# Patient Record
Sex: Male | Born: 1935 | Race: White | Hispanic: No | Smoking: Current every day smoker
Health system: Southern US, Community
[De-identification: ages and names within clinical notes are randomized; demographics above are authoritative.]

## PROBLEM LIST (undated history)

## (undated) DIAGNOSIS — F329 Major depressive disorder, single episode, unspecified: Secondary | ICD-10-CM

## (undated) DIAGNOSIS — R059 Cough, unspecified: Secondary | ICD-10-CM

## (undated) DIAGNOSIS — I10 Essential (primary) hypertension: Secondary | ICD-10-CM

## (undated) DIAGNOSIS — F32A Depression, unspecified: Secondary | ICD-10-CM

## (undated) DIAGNOSIS — G4730 Sleep apnea, unspecified: Secondary | ICD-10-CM

## (undated) DIAGNOSIS — H3530 Unspecified macular degeneration: Secondary | ICD-10-CM

## (undated) DIAGNOSIS — R05 Cough: Secondary | ICD-10-CM

## (undated) DIAGNOSIS — C801 Malignant (primary) neoplasm, unspecified: Secondary | ICD-10-CM

## (undated) DIAGNOSIS — R590 Localized enlarged lymph nodes: Secondary | ICD-10-CM

## (undated) HISTORY — DX: Cough: R05

## (undated) HISTORY — PX: COLONOSCOPY: SHX5424

## (undated) HISTORY — PX: PROSTATECTOMY: SHX69

## (undated) HISTORY — PX: TOTAL KNEE ARTHROPLASTY: SHX125

## (undated) HISTORY — DX: Sleep apnea, unspecified: G47.30

## (undated) HISTORY — PX: UVULOPALATOPHARYNGOPLASTY: SHX827

## (undated) HISTORY — PX: SEPTOPLASTY: SUR1290

## (undated) HISTORY — DX: Major depressive disorder, single episode, unspecified: F32.9

## (undated) HISTORY — DX: Essential (primary) hypertension: I10

## (undated) HISTORY — DX: Depression, unspecified: F32.A

## (undated) HISTORY — DX: Cough, unspecified: R05.9

---

## 2000-11-13 ENCOUNTER — Ambulatory Visit (HOSPITAL_COMMUNITY): Admission: RE | Admit: 2000-11-13 | Discharge: 2000-11-13 | Payer: Self-pay | Admitting: Internal Medicine

## 2001-05-24 ENCOUNTER — Encounter: Admission: RE | Admit: 2001-05-24 | Discharge: 2001-05-24 | Payer: Self-pay | Admitting: Urology

## 2001-05-24 ENCOUNTER — Encounter: Payer: Self-pay | Admitting: Urology

## 2001-06-11 ENCOUNTER — Ambulatory Visit: Admission: RE | Admit: 2001-06-11 | Discharge: 2001-09-09 | Payer: Self-pay | Admitting: Radiation Oncology

## 2001-07-26 ENCOUNTER — Encounter: Payer: Self-pay | Admitting: Urology

## 2001-08-02 ENCOUNTER — Inpatient Hospital Stay (HOSPITAL_COMMUNITY): Admission: RE | Admit: 2001-08-02 | Discharge: 2001-08-05 | Payer: Self-pay | Admitting: Urology

## 2001-12-18 ENCOUNTER — Other Ambulatory Visit: Admission: RE | Admit: 2001-12-18 | Discharge: 2001-12-18 | Payer: Self-pay | Admitting: Dermatology

## 2005-06-23 ENCOUNTER — Ambulatory Visit: Admission: RE | Admit: 2005-06-23 | Discharge: 2005-06-23 | Payer: Self-pay | Admitting: Internal Medicine

## 2005-07-04 ENCOUNTER — Ambulatory Visit: Payer: Self-pay | Admitting: Pulmonary Disease

## 2005-08-03 ENCOUNTER — Ambulatory Visit: Payer: Self-pay | Admitting: Pulmonary Disease

## 2006-07-19 ENCOUNTER — Emergency Department (HOSPITAL_COMMUNITY): Admission: EM | Admit: 2006-07-19 | Discharge: 2006-07-19 | Payer: Self-pay | Admitting: Emergency Medicine

## 2010-10-14 ENCOUNTER — Other Ambulatory Visit (HOSPITAL_COMMUNITY): Payer: Self-pay | Admitting: Internal Medicine

## 2010-10-14 ENCOUNTER — Ambulatory Visit (HOSPITAL_COMMUNITY)
Admission: RE | Admit: 2010-10-14 | Discharge: 2010-10-14 | Disposition: A | Discharge status: Home / Self Care | Payer: Medicare Other | Source: Ambulatory Visit | Attending: Internal Medicine | Admitting: Internal Medicine

## 2010-10-14 DIAGNOSIS — R05 Cough: Secondary | ICD-10-CM

## 2010-10-14 DIAGNOSIS — F17200 Nicotine dependence, unspecified, uncomplicated: Secondary | ICD-10-CM | POA: Insufficient documentation

## 2010-10-14 DIAGNOSIS — R059 Cough, unspecified: Secondary | ICD-10-CM | POA: Insufficient documentation

## 2010-10-25 ENCOUNTER — Telehealth: Payer: Self-pay | Admitting: Pulmonary Disease

## 2010-10-25 NOTE — Telephone Encounter (Signed)
ATC patient to inform of our policy on changing MD's. Home and Work numbers continue to say disconnected. Will have to wait for patient to call back and be SURE to get WORKING numbers for patient.

## 2010-10-31 NOTE — Telephone Encounter (Signed)
Phone still disconnected, will sign encounter and await for pt to call back

## 2010-12-05 ENCOUNTER — Encounter: Payer: Self-pay | Admitting: Internal Medicine

## 2010-12-05 ENCOUNTER — Ambulatory Visit (INDEPENDENT_AMBULATORY_CARE_PROVIDER_SITE_OTHER): Payer: Medicare Other | Admitting: Internal Medicine

## 2010-12-05 VITALS — BP 132/60 | HR 74 | Ht 70.0 in | Wt 206.0 lb

## 2010-12-05 DIAGNOSIS — J449 Chronic obstructive pulmonary disease, unspecified: Secondary | ICD-10-CM

## 2010-12-05 DIAGNOSIS — G4733 Obstructive sleep apnea (adult) (pediatric): Secondary | ICD-10-CM | POA: Insufficient documentation

## 2010-12-05 DIAGNOSIS — J310 Chronic rhinitis: Secondary | ICD-10-CM

## 2010-12-05 NOTE — Assessment & Plan Note (Addendum)
Severe OSA,. Will autotitrate for pressure update and see how we do add improving compliance. He lives alone but nobody to complain or to remind him to wear his CPAP. We considered alternative management options. Our success will depend on our ability to improve his daytime tiredness.Marland Kitchen

## 2010-12-05 NOTE — Progress Notes (Signed)
Subjective:    Patient ID: Alexander Young, male    DOB: March 11, 1930, 77 y.o.   MRN: [id number]  HPI 12/05/10- 23 yoM smoker, referred courtesy of Dr Ouida Sills for evaluation of fatigue " no energy" with hx of OSA. NPSG 06/23/05-severe obstructive sleep apnea, AHI 86 per hour. He had oxygen desaturation during sleep and home oxygen for sleep was recommended based on this study. He also had frequent limb jerks. Marland Kitchen He dropped off CPAP years  ago because it made his nose stuffy. His wife had light CPAP as it stopped his snoring, but she has passed away and he now lives alone. His main complaint is of daytime fatigue and lack of energy which is not always recognizes sleepiness. He doesn't think he is depressed but that has been evaluated. Bedtime is between 8 and 9 PM with sleep latency 5-10 minutes. He then wakes 3-6 times during the night for nocturia (history of prostatectomy) before finally up at 6:30 AM. Weight has varied about 10 pounds over the last few years. He had had limited palatoplasty with septoplasty by ENT surgeon with no benefit seen. He is smoking 10 cigarettes per day and recognizes occasional mild wheeze but denies shortness of breath. Her blood pressure is been treated with lisinopril but he denies significant cough. There is some history of allergic nasal congestion and spring and summer. He is widowed, a retired Tour manager.   Review of Systems Constitutional:   No-   weight loss, night sweats, fevers, chills,              + fatigue, lassitude. HEENT:   No-  headaches, difficulty swallowing, tooth/dental problems, sore throat,       No-  sneezing, itching, ear ache, nasal congestion, post nasal drip,  CV:  No-   chest pain, orthopnea, PND, swelling in lower extremities, anasarca, dizziness, palpitations Resp: No-   shortness of breath with exertion or at rest.              No-   productive cough,  No non-productive cough,  No-  coughing up of blood.              No-   change  in color of mucus.  No- wheezing.   Skin: No-   rash or lesions. GI:  No-   heartburn, indigestion, abdominal pain, nausea, vomiting, diarrhea,                 change in bowel habits, loss of appetite GU: No-   dysuria, change in color of urine, no urgency or frequency.  No- flank pain. MS:  No-   joint pain or swelling.  No- decreased range of motion.  No- back pain. Neuro- grossly normal to observation, Or:  Psych:  No- change in mood or affect. No depression or anxiety.  No memory loss.      Objective:   Physical Exam General- Alert, Oriented, Affect-appropriate, Distress- none acute; medium build Skin- rash-none, lesions- none, excoriation- none Lymphadenopathy- none Head- atraumatic            Eyes- Gross vision intact, PERRLA, conjunctivae clear secretions            Ears- Hearing, - reduced            Nose- turbinates edema and mild septal deviation with some external deviation and narrowed airway, No- mucus, polyps, erosion, perforation             Throat- Mallampati III-IV-  s/p minor reduction of uvula, mucosa clear , drainage- none, tonsils- atrophic Neck- flexible , trachea midline, no stridor , thyroid nl, carotid no bruit Chest - symmetrical excursion , unlabored           Heart/CV- RRR , no murmur , no gallop  , no rub, nl s1 s2                           - JVD- none , edema- none, stasis changes- none, varices- none           Lung- mild rhonchi and minimal wheeze, unlabored, cough- none , dullness-none, rub- none           Chest wall-  Abd- tender-no, distended-no, bowel sounds-present, HSM- no Br/ Gen/ Rectal- Not done, not indicated Extrem- cyanosis- none, clubbing, none, atrophy- none, strength- nl Neuro- grossly intact to observation         Assessment & Plan:

## 2010-12-05 NOTE — Assessment & Plan Note (Addendum)
Ice or rhonchi and suspect he has a chronic bronchitis and COPD. I don't believe he can hear his own airway noise. Schedule PFT

## 2010-12-05 NOTE — Assessment & Plan Note (Signed)
Sample Nasonex

## 2010-12-05 NOTE — Patient Instructions (Signed)
Order- [facility]  - autotitrate for pressure recommendation 5-20 cwp x 2 weeks and assess for best fit mask  Dx OSA  Schedule PFT   Dx COPD  Sample Nasonex- 1-2 sprays each nostril, every night at bedtime

## 2011-01-18 ENCOUNTER — Encounter: Payer: Self-pay | Admitting: Internal Medicine

## 2011-01-18 ENCOUNTER — Ambulatory Visit (INDEPENDENT_AMBULATORY_CARE_PROVIDER_SITE_OTHER): Payer: Medicare Other | Admitting: Internal Medicine

## 2011-01-18 VITALS — BP 124/68 | HR 67 | Ht 70.0 in | Wt 203.4 lb

## 2011-01-18 DIAGNOSIS — J310 Chronic rhinitis: Secondary | ICD-10-CM

## 2011-01-18 DIAGNOSIS — G4733 Obstructive sleep apnea (adult) (pediatric): Secondary | ICD-10-CM

## 2011-01-18 NOTE — Progress Notes (Signed)
Patient ID: Alexander Young, male    DOB: Feb 23, 1930, 77 y.o.   MRN: [id number]  HPI 12/05/10- 55 yoM smoker, referred courtesy of Dr Ouida Sills for evaluation of fatigue " no energy" with hx of OSA. NPSG 06/23/05-severe obstructive sleep apnea, AHI 86 per hour. He had oxygen desaturation during sleep and home oxygen for sleep was recommended based on this study. He also had frequent limb jerks. Marland Kitchen He dropped off CPAP years  ago because it made his nose stuffy. His wife had liked CPAP as it stopped his snoring, but she has passed away and he now lives alone. His main complaint is of daytime fatigue and lack of energy which is not always recognized as sleepiness. He doesn't think he is depressed but that has been evaluated. Bedtime is between 8 and 9 PM with sleep latency 5-10 minutes. He then wakes 3-6 times during the night for nocturia (history of prostatectomy) before finally up at 6:30 AM. Weight has varied about 10 pounds over the last few years. He had had limited palatoplasty with septoplasty by ENT surgeon with no benefit seen. He is smoking 10 cigarettes per day and recognizes occasional mild wheeze but denies shortness of breath. Her blood pressure is being treated with lisinopril but he denies significant cough. There is some history of allergic nasal congestion in spring and summer. He is widowed, a retired Tour manager.  01/18/11- OSA, limb jerks, hx UPPP, failed CPAP, rhinitis, tobacco He gave up on CPAP, not able to keep it on more than 2 hours at a time. He blames nasal congestion which begins as soon as he lies down and is worse with CPAP. He also has nocturia as much as 5 or 6 times a night. We discussed alternatives to CPAP therapy.  Review of Systems Constitutional:   No-   weight loss, night sweats, fevers, chills,              + fatigue, lassitude. HEENT:   No-  headaches, difficulty swallowing, tooth/dental problems, sore throat,       No-  sneezing, itching, ear ache, nasal  congestion, post nasal drip,  CV:  No-   chest pain, orthopnea, PND, swelling in lower extremities, anasarca, dizziness, palpitations Resp: No-   shortness of breath with exertion or at rest.              No-   productive cough,  No non-productive cough,  No-  coughing up of blood.              No-   change in color of mucus.  No- wheezing.   Skin: No-   rash or lesions. GI:  No-   heartburn, indigestion, abdominal pain, nausea, vomiting, diarrhea,                 change in bowel habits, loss of appetite GU: No-   dysuria, change in color of urine, no urgency + frequency.  No- flank pain. MS:  No-   joint pain or swelling.  No- decreased range of motion.  No- back pain. Neuro- grossly normal to observation, Or:  Psych:  No- change in mood or affect. No depression or anxiety.  No memory loss.      Objective:   Physical Exam General- Alert, Oriented, Affect-appropriate, Distress- none acute; overweight Skin- rash-none, lesions- none, excoriation- none Lymphadenopathy- none Head- atraumatic            Eyes- Gross vision intact, PERRLA, conjunctivae  clear secretions            Ears- Hearing, - reduced            Nose- turbinates edema and mild septal deviation with some external deviation and narrowed airway, No- mucus, polyps, erosion, perforation             Throat- Mallampati III-IV- s/p minor reduction of uvula, mucosa clear , drainage- none, tonsils- atrophic Neck- flexible , trachea midline, no stridor , thyroid nl, carotid no bruit Chest - symmetrical excursion , unlabored           Heart/CV- RRR , no murmur , no gallop  , no rub, nl s1 s2                           - JVD- none , edema- none, stasis changes- none, varices- none           Lung- mild rhonchi and minimal wheeze, unlabored, cough- none , dullness-none, rub- none           Chest wall-  Abd- tender-no, distended-no, bowel sounds-present, HSM- no Br/ Gen/ Rectal- Not done, not indicated Extrem- cyanosis- none, clubbing,  none, atrophy- none, strength- nl Neuro- grossly intact to observation

## 2011-01-18 NOTE — Patient Instructions (Signed)
Try Sudafed an hour before bedtime- see if it stops your stuffy nose and what it does to your need to get up for the bathroom at night.  You don't want the strongest Sudafed- see if they have something like 30 mg or 60 mg. You could also ask the drug store for the alternative to sudafed called phenylephrine, which is on the shelf.  We will stop CPAP for now.

## 2011-01-21 NOTE — Assessment & Plan Note (Signed)
Sudafed may reduce nasal congestion and also improve bladder sphincter control. It's worth a trial.

## 2011-01-21 NOTE — Assessment & Plan Note (Signed)
He doesn't want to do anything more about CPAP for sleep apnea. I was able to review again, alternatives. He is going to try Sudafed. We emphasized weight loss.

## 2011-01-25 ENCOUNTER — Encounter: Payer: Self-pay | Admitting: Internal Medicine

## 2011-07-03 DIAGNOSIS — C61 Malignant neoplasm of prostate: Secondary | ICD-10-CM | POA: Diagnosis not present

## 2011-07-28 DIAGNOSIS — I10 Essential (primary) hypertension: Secondary | ICD-10-CM | POA: Diagnosis not present

## 2011-07-28 DIAGNOSIS — R7301 Impaired fasting glucose: Secondary | ICD-10-CM | POA: Diagnosis not present

## 2011-07-28 DIAGNOSIS — Z79899 Other long term (current) drug therapy: Secondary | ICD-10-CM | POA: Diagnosis not present

## 2011-08-03 DIAGNOSIS — R7301 Impaired fasting glucose: Secondary | ICD-10-CM | POA: Diagnosis not present

## 2011-08-03 DIAGNOSIS — M1990 Unspecified osteoarthritis, unspecified site: Secondary | ICD-10-CM | POA: Diagnosis not present

## 2011-08-03 DIAGNOSIS — I10 Essential (primary) hypertension: Secondary | ICD-10-CM | POA: Diagnosis not present

## 2011-08-03 DIAGNOSIS — C61 Malignant neoplasm of prostate: Secondary | ICD-10-CM | POA: Diagnosis not present

## 2011-09-29 DIAGNOSIS — H0230 Blepharochalasis unspecified eye, unspecified eyelid: Secondary | ICD-10-CM | POA: Diagnosis not present

## 2011-09-29 DIAGNOSIS — H3530 Unspecified macular degeneration: Secondary | ICD-10-CM | POA: Diagnosis not present

## 2011-09-29 DIAGNOSIS — Z961 Presence of intraocular lens: Secondary | ICD-10-CM | POA: Diagnosis not present

## 2011-12-27 DIAGNOSIS — J310 Chronic rhinitis: Secondary | ICD-10-CM | POA: Diagnosis not present

## 2012-01-25 DIAGNOSIS — Z23 Encounter for immunization: Secondary | ICD-10-CM | POA: Diagnosis not present

## 2012-02-06 DIAGNOSIS — J310 Chronic rhinitis: Secondary | ICD-10-CM | POA: Diagnosis not present

## 2012-02-06 DIAGNOSIS — I10 Essential (primary) hypertension: Secondary | ICD-10-CM | POA: Diagnosis not present

## 2012-02-27 DIAGNOSIS — H3530 Unspecified macular degeneration: Secondary | ICD-10-CM | POA: Diagnosis not present

## 2012-02-27 DIAGNOSIS — H353290 Exudative age-related macular degeneration, unspecified eye, stage unspecified: Secondary | ICD-10-CM | POA: Diagnosis not present

## 2012-02-27 DIAGNOSIS — H35059 Retinal neovascularization, unspecified, unspecified eye: Secondary | ICD-10-CM | POA: Diagnosis not present

## 2012-02-27 DIAGNOSIS — H353190 Nonexudative age-related macular degeneration, unspecified eye, stage unspecified: Secondary | ICD-10-CM | POA: Diagnosis not present

## 2012-02-27 DIAGNOSIS — H0230 Blepharochalasis unspecified eye, unspecified eyelid: Secondary | ICD-10-CM | POA: Diagnosis not present

## 2012-02-27 DIAGNOSIS — Z961 Presence of intraocular lens: Secondary | ICD-10-CM | POA: Diagnosis not present

## 2012-02-27 DIAGNOSIS — H35369 Drusen (degenerative) of macula, unspecified eye: Secondary | ICD-10-CM | POA: Diagnosis not present

## 2012-03-01 DIAGNOSIS — H353290 Exudative age-related macular degeneration, unspecified eye, stage unspecified: Secondary | ICD-10-CM | POA: Diagnosis not present

## 2012-03-01 DIAGNOSIS — H35059 Retinal neovascularization, unspecified, unspecified eye: Secondary | ICD-10-CM | POA: Diagnosis not present

## 2012-04-04 DIAGNOSIS — H353290 Exudative age-related macular degeneration, unspecified eye, stage unspecified: Secondary | ICD-10-CM | POA: Diagnosis not present

## 2012-04-04 DIAGNOSIS — H35059 Retinal neovascularization, unspecified, unspecified eye: Secondary | ICD-10-CM | POA: Diagnosis not present

## 2012-05-17 DIAGNOSIS — H353290 Exudative age-related macular degeneration, unspecified eye, stage unspecified: Secondary | ICD-10-CM | POA: Diagnosis not present

## 2012-05-17 DIAGNOSIS — H35059 Retinal neovascularization, unspecified, unspecified eye: Secondary | ICD-10-CM | POA: Diagnosis not present

## 2012-05-17 DIAGNOSIS — H3570 Unspecified separation of retinal layers: Secondary | ICD-10-CM | POA: Diagnosis not present

## 2012-05-17 DIAGNOSIS — H43829 Vitreomacular adhesion, unspecified eye: Secondary | ICD-10-CM | POA: Diagnosis not present

## 2012-06-17 ENCOUNTER — Other Ambulatory Visit: Payer: Self-pay | Admitting: Dermatology

## 2012-06-17 DIAGNOSIS — L570 Actinic keratosis: Secondary | ICD-10-CM | POA: Diagnosis not present

## 2012-06-17 DIAGNOSIS — C44319 Basal cell carcinoma of skin of other parts of face: Secondary | ICD-10-CM | POA: Diagnosis not present

## 2012-06-17 DIAGNOSIS — D239 Other benign neoplasm of skin, unspecified: Secondary | ICD-10-CM | POA: Diagnosis not present

## 2012-06-17 DIAGNOSIS — D2320 Other benign neoplasm of skin of unspecified ear and external auricular canal: Secondary | ICD-10-CM | POA: Diagnosis not present

## 2012-06-17 DIAGNOSIS — L819 Disorder of pigmentation, unspecified: Secondary | ICD-10-CM | POA: Diagnosis not present

## 2012-06-17 DIAGNOSIS — L723 Sebaceous cyst: Secondary | ICD-10-CM | POA: Diagnosis not present

## 2012-06-17 DIAGNOSIS — C4441 Basal cell carcinoma of skin of scalp and neck: Secondary | ICD-10-CM | POA: Diagnosis not present

## 2012-06-17 DIAGNOSIS — D485 Neoplasm of uncertain behavior of skin: Secondary | ICD-10-CM | POA: Diagnosis not present

## 2012-06-17 DIAGNOSIS — L821 Other seborrheic keratosis: Secondary | ICD-10-CM | POA: Diagnosis not present

## 2012-06-20 DIAGNOSIS — H353290 Exudative age-related macular degeneration, unspecified eye, stage unspecified: Secondary | ICD-10-CM | POA: Diagnosis not present

## 2012-06-20 DIAGNOSIS — H35059 Retinal neovascularization, unspecified, unspecified eye: Secondary | ICD-10-CM | POA: Diagnosis not present

## 2012-07-01 DIAGNOSIS — N529 Male erectile dysfunction, unspecified: Secondary | ICD-10-CM | POA: Diagnosis not present

## 2012-07-01 DIAGNOSIS — C61 Malignant neoplasm of prostate: Secondary | ICD-10-CM | POA: Diagnosis not present

## 2012-07-24 DIAGNOSIS — H35059 Retinal neovascularization, unspecified, unspecified eye: Secondary | ICD-10-CM | POA: Diagnosis not present

## 2012-07-24 DIAGNOSIS — H353290 Exudative age-related macular degeneration, unspecified eye, stage unspecified: Secondary | ICD-10-CM | POA: Diagnosis not present

## 2012-08-01 DIAGNOSIS — F3289 Other specified depressive episodes: Secondary | ICD-10-CM | POA: Diagnosis not present

## 2012-08-01 DIAGNOSIS — F329 Major depressive disorder, single episode, unspecified: Secondary | ICD-10-CM | POA: Diagnosis not present

## 2012-08-01 DIAGNOSIS — I10 Essential (primary) hypertension: Secondary | ICD-10-CM | POA: Diagnosis not present

## 2012-08-01 DIAGNOSIS — R7301 Impaired fasting glucose: Secondary | ICD-10-CM | POA: Diagnosis not present

## 2012-08-01 DIAGNOSIS — Z79899 Other long term (current) drug therapy: Secondary | ICD-10-CM | POA: Diagnosis not present

## 2012-08-12 DIAGNOSIS — I10 Essential (primary) hypertension: Secondary | ICD-10-CM | POA: Diagnosis not present

## 2012-08-12 DIAGNOSIS — R7301 Impaired fasting glucose: Secondary | ICD-10-CM | POA: Diagnosis not present

## 2012-08-12 DIAGNOSIS — Z79899 Other long term (current) drug therapy: Secondary | ICD-10-CM | POA: Diagnosis not present

## 2012-08-12 DIAGNOSIS — F329 Major depressive disorder, single episode, unspecified: Secondary | ICD-10-CM | POA: Diagnosis not present

## 2012-08-15 DIAGNOSIS — I10 Essential (primary) hypertension: Secondary | ICD-10-CM | POA: Diagnosis not present

## 2012-08-15 DIAGNOSIS — I4949 Other premature depolarization: Secondary | ICD-10-CM | POA: Diagnosis not present

## 2012-08-15 DIAGNOSIS — G4730 Sleep apnea, unspecified: Secondary | ICD-10-CM | POA: Diagnosis not present

## 2012-08-15 DIAGNOSIS — R7301 Impaired fasting glucose: Secondary | ICD-10-CM | POA: Diagnosis not present

## 2012-08-28 DIAGNOSIS — H353290 Exudative age-related macular degeneration, unspecified eye, stage unspecified: Secondary | ICD-10-CM | POA: Diagnosis not present

## 2012-08-28 DIAGNOSIS — H35059 Retinal neovascularization, unspecified, unspecified eye: Secondary | ICD-10-CM | POA: Diagnosis not present

## 2012-10-01 DIAGNOSIS — H353290 Exudative age-related macular degeneration, unspecified eye, stage unspecified: Secondary | ICD-10-CM | POA: Diagnosis not present

## 2012-10-01 DIAGNOSIS — H35059 Retinal neovascularization, unspecified, unspecified eye: Secondary | ICD-10-CM | POA: Diagnosis not present

## 2012-10-30 DIAGNOSIS — R21 Rash and other nonspecific skin eruption: Secondary | ICD-10-CM | POA: Diagnosis not present

## 2012-11-06 ENCOUNTER — Encounter (INDEPENDENT_AMBULATORY_CARE_PROVIDER_SITE_OTHER): Payer: Self-pay

## 2012-11-06 ENCOUNTER — Encounter (INDEPENDENT_AMBULATORY_CARE_PROVIDER_SITE_OTHER): Payer: Self-pay | Admitting: *Deleted

## 2012-11-12 DIAGNOSIS — H353290 Exudative age-related macular degeneration, unspecified eye, stage unspecified: Secondary | ICD-10-CM | POA: Diagnosis not present

## 2012-11-12 DIAGNOSIS — H35059 Retinal neovascularization, unspecified, unspecified eye: Secondary | ICD-10-CM | POA: Diagnosis not present

## 2012-12-25 ENCOUNTER — Other Ambulatory Visit (INDEPENDENT_AMBULATORY_CARE_PROVIDER_SITE_OTHER): Payer: Self-pay | Admitting: *Deleted

## 2012-12-25 ENCOUNTER — Telehealth (INDEPENDENT_AMBULATORY_CARE_PROVIDER_SITE_OTHER): Payer: Self-pay | Admitting: *Deleted

## 2012-12-25 DIAGNOSIS — Z86010 Personal history of colonic polyps: Secondary | ICD-10-CM

## 2012-12-25 DIAGNOSIS — Z1211 Encounter for screening for malignant neoplasm of colon: Secondary | ICD-10-CM

## 2012-12-25 DIAGNOSIS — Z800 Family history of malignant neoplasm of digestive organs: Secondary | ICD-10-CM

## 2012-12-25 NOTE — Telephone Encounter (Signed)
Patient needs movi prep 

## 2012-12-27 MED ORDER — PEG-KCL-NACL-NASULF-NA ASC-C 100 G PO SOLR: 1.0000 | Freq: Once | ORAL | Status: DC

## 2012-12-31 DIAGNOSIS — H35059 Retinal neovascularization, unspecified, unspecified eye: Secondary | ICD-10-CM | POA: Diagnosis not present

## 2012-12-31 DIAGNOSIS — H35359 Cystoid macular degeneration, unspecified eye: Secondary | ICD-10-CM | POA: Diagnosis not present

## 2012-12-31 DIAGNOSIS — H353290 Exudative age-related macular degeneration, unspecified eye, stage unspecified: Secondary | ICD-10-CM | POA: Diagnosis not present

## 2013-01-23 DIAGNOSIS — Z23 Encounter for immunization: Secondary | ICD-10-CM | POA: Diagnosis not present

## 2013-02-05 DIAGNOSIS — H353290 Exudative age-related macular degeneration, unspecified eye, stage unspecified: Secondary | ICD-10-CM | POA: Diagnosis not present

## 2013-02-05 DIAGNOSIS — H35059 Retinal neovascularization, unspecified, unspecified eye: Secondary | ICD-10-CM | POA: Diagnosis not present

## 2013-02-05 DIAGNOSIS — H35359 Cystoid macular degeneration, unspecified eye: Secondary | ICD-10-CM | POA: Diagnosis not present

## 2013-02-06 ENCOUNTER — Telehealth (INDEPENDENT_AMBULATORY_CARE_PROVIDER_SITE_OTHER): Payer: Self-pay | Admitting: *Deleted

## 2013-02-06 NOTE — Telephone Encounter (Signed)
  Procedure: tcs  Reason/Indication:  Hx polyps, fam hx colon ca  Has patient had this procedure before?  Yes, 2009 (scanned)  If so, when, by whom and where?    Is there a family history of colon cancer?  Yes, father  Who?  What age when diagnosed?    Is patient diabetic?   no      Does patient have prosthetic heart valve?  no  Do you have a pacemaker?  no  Has patient ever had endocarditis? no  Has patient had joint replacement within last 12 months?  no  Does patient tend to be constipated or take laxatives? n  Is patient on Coumadin, Plavix and/or Aspirin? no  Medications: lisinopril 20 mg daily, hctz 25 mg daily  Allergies: nkda  Medication Adjustment:   Procedure date & time: 02/27/13 at 730

## 2013-02-06 NOTE — Telephone Encounter (Signed)
agree

## 2013-02-17 DIAGNOSIS — R21 Rash and other nonspecific skin eruption: Secondary | ICD-10-CM | POA: Diagnosis not present

## 2013-02-17 DIAGNOSIS — I10 Essential (primary) hypertension: Secondary | ICD-10-CM | POA: Diagnosis not present

## 2013-02-19 ENCOUNTER — Encounter (HOSPITAL_COMMUNITY): Payer: Self-pay

## 2013-02-27 ENCOUNTER — Encounter (HOSPITAL_COMMUNITY)
Admission: RE | Disposition: A | Discharge status: Home / Self Care | Payer: Self-pay | Source: Ambulatory Visit | Attending: Internal Medicine

## 2013-02-27 ENCOUNTER — Encounter (HOSPITAL_COMMUNITY): Payer: Self-pay | Admitting: *Deleted

## 2013-02-27 ENCOUNTER — Ambulatory Visit (HOSPITAL_COMMUNITY)
Admission: RE | Admit: 2013-02-27 | Discharge: 2013-02-27 | Disposition: A | Discharge status: Home / Self Care | Payer: Medicare Other | Source: Ambulatory Visit | Attending: Internal Medicine | Admitting: Internal Medicine

## 2013-02-27 DIAGNOSIS — K5730 Diverticulosis of large intestine without perforation or abscess without bleeding: Secondary | ICD-10-CM | POA: Diagnosis not present

## 2013-02-27 DIAGNOSIS — Z860100 Personal history of colon polyps, unspecified: Secondary | ICD-10-CM | POA: Insufficient documentation

## 2013-02-27 DIAGNOSIS — D126 Benign neoplasm of colon, unspecified: Secondary | ICD-10-CM | POA: Insufficient documentation

## 2013-02-27 DIAGNOSIS — I10 Essential (primary) hypertension: Secondary | ICD-10-CM | POA: Insufficient documentation

## 2013-02-27 DIAGNOSIS — Z800 Family history of malignant neoplasm of digestive organs: Secondary | ICD-10-CM

## 2013-02-27 HISTORY — PX: COLONOSCOPY: SHX5424

## 2013-02-27 SURGERY — COLONOSCOPY
Anesthesia: Moderate Sedation

## 2013-02-27 MED ORDER — MIDAZOLAM HCL 5 MG/5ML IJ SOLN
INTRAMUSCULAR | Status: DC | PRN
  Administered 2013-02-27: 2 mg via INTRAVENOUS
  Administered 2013-02-27: 1 mg via INTRAVENOUS

## 2013-02-27 MED ORDER — SODIUM CHLORIDE 0.9 % IV SOLN
INTRAVENOUS | Status: DC
  Administered 2013-02-27: 07:00:00 via INTRAVENOUS

## 2013-02-27 MED ORDER — MEPERIDINE HCL 50 MG/ML IJ SOLN
INTRAMUSCULAR | Status: AC
  Filled 2013-02-27: qty 1

## 2013-02-27 MED ORDER — MEPERIDINE HCL 50 MG/ML IJ SOLN
INTRAMUSCULAR | Status: DC | PRN
  Administered 2013-02-27: 25 mg via INTRAVENOUS

## 2013-02-27 MED ORDER — MIDAZOLAM HCL 5 MG/5ML IJ SOLN
INTRAMUSCULAR | Status: AC
  Filled 2013-02-27: qty 10

## 2013-02-27 MED ORDER — SIMETHICONE 40 MG/0.6ML PO SUSP
ORAL | Status: DC | PRN
  Administered 2013-02-27: 08:00:00

## 2013-02-27 NOTE — Op Note (Signed)
COLONOSCOPY PROCEDURE REPORT  PATIENT:  Alexander Young  MR#:  [id number] Birthdate:  04/07/1929, 79 y.o., male Endoscopist:  Dr. Malissa Hippo, MD Referred By:  Dr. Carylon Perches, MD  Procedure Date: 02/27/2013  Procedure:   Colonoscopy  Indications: Patient is an 79 year old Caucasian male with history of colonic adenomas. His last colonoscopy was in July 2009  Informed Consent:  The procedure and risks were reviewed with the patient and informed consent was obtained.  Medications:  Demerol 25 mg IV Versed 3 mg IV  Description of procedure:  After a digital rectal exam was performed, that colonoscope was advanced from the anus through the rectum and colon to the area of the cecum, ileocecal valve and appendiceal orifice. The cecum was deeply intubated. These structures were well-seen and photographed for the record. From the level of the cecum and ileocecal valve, the scope was slowly and cautiously withdrawn. The mucosal surfaces were carefully surveyed utilizing scope tip to flexion to facilitate fold flattening as needed. The scope was pulled down into the rectum where a thorough exam including retroflexion was performed.  Findings:   Prep excellent. 3 mm polyp ablated via cold biopsy from proximal transverse colon. Multiple diverticula at sigmoid colon with a few more scattered the rest of the colon. Normal rectal mucosa and anal rectal junction.   Therapeutic/Diagnostic Maneuvers Performed:  See above  Complications:  None  Cecal Withdrawal Time: 8 minutes  Impression:  Examination performed to cecum. 3 mm polyp ablated while cold biopsy from proximal transverse colon. Pancolonic diverticulosis.  Recommendations:  Standard instructions given. I will contact patient with biopsy results and further recommendations.  [name],[name]  02/27/2013 8:06 AM  CC: Dr. Carylon Perches, MD & Dr. Bonnetta Barry ref. provider found

## 2013-02-27 NOTE — H&P (Signed)
Alexander Young is an 79 y.o. male.   Chief Complaint: Patient is here for colonoscopy. HPI: Patient is an 79 year old Caucasian male with history of colonic adenomas and is here for surveillance colonoscopy. His last exam was in July 2009. He denies change in bowel habits rectal bleeding or abdominal pain. Family history is negative for CRC to  Past Medical History  Diagnosis Date  . Hypertension   . Depression   . Sleep apnea   . Cough     Past Surgical History  Procedure Laterality Date  . Colonoscopy    . Prostatectomy    . Total knee arthroplasty Bilateral     History reviewed. No pertinent family history. Social History:  reports that he has been smoking Cigarettes.  He has a 32.5 pack-year smoking history. He does not have any smokeless tobacco history on file. He reports that he drinks alcohol. He reports that he does not use illicit drugs.  Allergies: No Known Allergies  Medications Prior to Admission  Medication Sig Dispense Refill  . chlorthalidone (HYGROTON) 25 MG tablet Take 1 tablet by mouth daily.      Marland Kitchen Fexofenadine-Pseudoephedrine (ALLEGRA-D PO) Take 1 tablet by mouth every other day.      . lisinopril (PRINIVIL,ZESTRIL) 20 MG tablet Take 20 mg by mouth daily.          No results found for this or any previous visit (from the past 48 hour(s)). No results found.  ROS  Blood pressure 129/81, pulse 90, temperature 98 F (36.7 C), temperature source Oral, resp. rate 18, SpO2 93.00%. Physical Exam  Constitutional: He appears well-developed and well-nourished.  HENT:  Mouth/Throat: Oropharynx is clear and moist.  Eyes: Conjunctivae are normal. No scleral icterus.  Neck: No thyromegaly present.  Cardiovascular: Normal rate, regular rhythm and normal heart sounds.   No murmur heard. Respiratory: Effort normal and breath sounds normal.  GI: Soft. He exhibits no distension and no mass. There is no tenderness.  Musculoskeletal: He exhibits no edema.   Lymphadenopathy:    He has no cervical adenopathy.  Neurological: He is alert.  Skin: Skin is warm and dry.     Assessment/Plan History of colonic adenomas. Surveillance colonoscopy.  Alexander Young 02/27/2013, 7:31 AM

## 2013-03-03 ENCOUNTER — Encounter (INDEPENDENT_AMBULATORY_CARE_PROVIDER_SITE_OTHER): Payer: Self-pay | Admitting: *Deleted

## 2013-03-05 ENCOUNTER — Encounter (HOSPITAL_COMMUNITY): Payer: Self-pay | Admitting: Internal Medicine

## 2013-03-07 DIAGNOSIS — L259 Unspecified contact dermatitis, unspecified cause: Secondary | ICD-10-CM | POA: Diagnosis not present

## 2013-03-24 DIAGNOSIS — H353290 Exudative age-related macular degeneration, unspecified eye, stage unspecified: Secondary | ICD-10-CM | POA: Diagnosis not present

## 2013-03-24 DIAGNOSIS — H35059 Retinal neovascularization, unspecified, unspecified eye: Secondary | ICD-10-CM | POA: Diagnosis not present

## 2013-05-01 DIAGNOSIS — H353290 Exudative age-related macular degeneration, unspecified eye, stage unspecified: Secondary | ICD-10-CM | POA: Diagnosis not present

## 2013-05-01 DIAGNOSIS — H353190 Nonexudative age-related macular degeneration, unspecified eye, stage unspecified: Secondary | ICD-10-CM | POA: Diagnosis not present

## 2013-06-05 DIAGNOSIS — H353290 Exudative age-related macular degeneration, unspecified eye, stage unspecified: Secondary | ICD-10-CM | POA: Diagnosis not present

## 2013-06-05 DIAGNOSIS — H35359 Cystoid macular degeneration, unspecified eye: Secondary | ICD-10-CM | POA: Diagnosis not present

## 2013-06-05 DIAGNOSIS — H35059 Retinal neovascularization, unspecified, unspecified eye: Secondary | ICD-10-CM | POA: Diagnosis not present

## 2013-06-18 DIAGNOSIS — D3130 Benign neoplasm of unspecified choroid: Secondary | ICD-10-CM | POA: Diagnosis not present

## 2013-06-18 DIAGNOSIS — H1045 Other chronic allergic conjunctivitis: Secondary | ICD-10-CM | POA: Diagnosis not present

## 2013-06-18 DIAGNOSIS — H353190 Nonexudative age-related macular degeneration, unspecified eye, stage unspecified: Secondary | ICD-10-CM | POA: Diagnosis not present

## 2013-06-18 DIAGNOSIS — Z961 Presence of intraocular lens: Secondary | ICD-10-CM | POA: Diagnosis not present

## 2013-07-07 DIAGNOSIS — C61 Malignant neoplasm of prostate: Secondary | ICD-10-CM | POA: Diagnosis not present

## 2013-07-07 DIAGNOSIS — N529 Male erectile dysfunction, unspecified: Secondary | ICD-10-CM | POA: Diagnosis not present

## 2013-08-07 DIAGNOSIS — H353290 Exudative age-related macular degeneration, unspecified eye, stage unspecified: Secondary | ICD-10-CM | POA: Diagnosis not present

## 2013-08-07 DIAGNOSIS — H35059 Retinal neovascularization, unspecified, unspecified eye: Secondary | ICD-10-CM | POA: Diagnosis not present

## 2013-08-15 DIAGNOSIS — Z79899 Other long term (current) drug therapy: Secondary | ICD-10-CM | POA: Diagnosis not present

## 2013-08-15 DIAGNOSIS — R7301 Impaired fasting glucose: Secondary | ICD-10-CM | POA: Diagnosis not present

## 2013-08-15 DIAGNOSIS — F329 Major depressive disorder, single episode, unspecified: Secondary | ICD-10-CM | POA: Diagnosis not present

## 2013-08-15 DIAGNOSIS — I10 Essential (primary) hypertension: Secondary | ICD-10-CM | POA: Diagnosis not present

## 2013-08-15 DIAGNOSIS — F3289 Other specified depressive episodes: Secondary | ICD-10-CM | POA: Diagnosis not present

## 2013-08-21 DIAGNOSIS — Z0000 Encounter for general adult medical examination without abnormal findings: Secondary | ICD-10-CM | POA: Diagnosis not present

## 2013-08-21 DIAGNOSIS — I4949 Other premature depolarization: Secondary | ICD-10-CM | POA: Diagnosis not present

## 2013-08-21 DIAGNOSIS — Z23 Encounter for immunization: Secondary | ICD-10-CM | POA: Diagnosis not present

## 2013-09-10 DIAGNOSIS — H353290 Exudative age-related macular degeneration, unspecified eye, stage unspecified: Secondary | ICD-10-CM | POA: Diagnosis not present

## 2013-09-10 DIAGNOSIS — H35059 Retinal neovascularization, unspecified, unspecified eye: Secondary | ICD-10-CM | POA: Diagnosis not present

## 2013-10-22 DIAGNOSIS — H353290 Exudative age-related macular degeneration, unspecified eye, stage unspecified: Secondary | ICD-10-CM | POA: Diagnosis not present

## 2013-10-22 DIAGNOSIS — H35059 Retinal neovascularization, unspecified, unspecified eye: Secondary | ICD-10-CM | POA: Diagnosis not present

## 2013-11-03 DIAGNOSIS — H6120 Impacted cerumen, unspecified ear: Secondary | ICD-10-CM | POA: Diagnosis not present

## 2013-11-27 ENCOUNTER — Ambulatory Visit (INDEPENDENT_AMBULATORY_CARE_PROVIDER_SITE_OTHER): Payer: Medicare Other | Admitting: Otolaryngology

## 2013-11-27 DIAGNOSIS — H6120 Impacted cerumen, unspecified ear: Secondary | ICD-10-CM

## 2013-11-27 DIAGNOSIS — H903 Sensorineural hearing loss, bilateral: Secondary | ICD-10-CM

## 2013-12-03 DIAGNOSIS — H353290 Exudative age-related macular degeneration, unspecified eye, stage unspecified: Secondary | ICD-10-CM | POA: Diagnosis not present

## 2013-12-03 DIAGNOSIS — H35059 Retinal neovascularization, unspecified, unspecified eye: Secondary | ICD-10-CM | POA: Diagnosis not present

## 2013-12-03 DIAGNOSIS — H35359 Cystoid macular degeneration, unspecified eye: Secondary | ICD-10-CM | POA: Diagnosis not present

## 2014-01-07 DIAGNOSIS — H3532 Exudative age-related macular degeneration: Secondary | ICD-10-CM | POA: Diagnosis not present

## 2014-01-07 DIAGNOSIS — H35052 Retinal neovascularization, unspecified, left eye: Secondary | ICD-10-CM | POA: Diagnosis not present

## 2014-01-23 DIAGNOSIS — Z23 Encounter for immunization: Secondary | ICD-10-CM | POA: Diagnosis not present

## 2014-02-11 DIAGNOSIS — H3532 Exudative age-related macular degeneration: Secondary | ICD-10-CM | POA: Diagnosis not present

## 2014-02-11 DIAGNOSIS — H35052 Retinal neovascularization, unspecified, left eye: Secondary | ICD-10-CM | POA: Diagnosis not present

## 2014-03-30 DIAGNOSIS — H35052 Retinal neovascularization, unspecified, left eye: Secondary | ICD-10-CM | POA: Diagnosis not present

## 2014-03-30 DIAGNOSIS — H3532 Exudative age-related macular degeneration: Secondary | ICD-10-CM | POA: Diagnosis not present

## 2014-04-10 DIAGNOSIS — E119 Type 2 diabetes mellitus without complications: Secondary | ICD-10-CM | POA: Diagnosis not present

## 2014-04-16 ENCOUNTER — Ambulatory Visit (INDEPENDENT_AMBULATORY_CARE_PROVIDER_SITE_OTHER): Payer: Medicare Other | Admitting: Otolaryngology

## 2014-04-16 DIAGNOSIS — J343 Hypertrophy of nasal turbinates: Secondary | ICD-10-CM

## 2014-04-16 DIAGNOSIS — H9072 Mixed conductive and sensorineural hearing loss, unilateral, left ear, with unrestricted hearing on the contralateral side: Secondary | ICD-10-CM

## 2014-04-16 DIAGNOSIS — H6983 Other specified disorders of Eustachian tube, bilateral: Secondary | ICD-10-CM | POA: Diagnosis not present

## 2014-04-16 DIAGNOSIS — H6123 Impacted cerumen, bilateral: Secondary | ICD-10-CM

## 2014-04-16 DIAGNOSIS — J342 Deviated nasal septum: Secondary | ICD-10-CM

## 2014-04-21 DIAGNOSIS — H26493 Other secondary cataract, bilateral: Secondary | ICD-10-CM | POA: Diagnosis not present

## 2014-04-21 DIAGNOSIS — Z961 Presence of intraocular lens: Secondary | ICD-10-CM | POA: Diagnosis not present

## 2014-04-28 DIAGNOSIS — H26492 Other secondary cataract, left eye: Secondary | ICD-10-CM | POA: Diagnosis not present

## 2014-04-28 DIAGNOSIS — Z961 Presence of intraocular lens: Secondary | ICD-10-CM | POA: Diagnosis not present

## 2014-05-14 ENCOUNTER — Ambulatory Visit (INDEPENDENT_AMBULATORY_CARE_PROVIDER_SITE_OTHER): Payer: Medicare Other | Admitting: Otolaryngology

## 2014-05-14 DIAGNOSIS — H903 Sensorineural hearing loss, bilateral: Secondary | ICD-10-CM | POA: Diagnosis not present

## 2014-05-14 DIAGNOSIS — H6983 Other specified disorders of Eustachian tube, bilateral: Secondary | ICD-10-CM | POA: Diagnosis not present

## 2014-05-18 DIAGNOSIS — E119 Type 2 diabetes mellitus without complications: Secondary | ICD-10-CM | POA: Diagnosis not present

## 2014-05-18 DIAGNOSIS — I10 Essential (primary) hypertension: Secondary | ICD-10-CM | POA: Diagnosis not present

## 2014-05-25 DIAGNOSIS — H35052 Retinal neovascularization, unspecified, left eye: Secondary | ICD-10-CM | POA: Diagnosis not present

## 2014-05-25 DIAGNOSIS — H3532 Exudative age-related macular degeneration: Secondary | ICD-10-CM | POA: Diagnosis not present

## 2014-06-01 DIAGNOSIS — M7021 Olecranon bursitis, right elbow: Secondary | ICD-10-CM | POA: Diagnosis not present

## 2014-06-04 DIAGNOSIS — M25421 Effusion, right elbow: Secondary | ICD-10-CM | POA: Diagnosis not present

## 2014-06-04 DIAGNOSIS — M25521 Pain in right elbow: Secondary | ICD-10-CM | POA: Diagnosis not present

## 2014-06-10 DIAGNOSIS — M542 Cervicalgia: Secondary | ICD-10-CM | POA: Diagnosis not present

## 2014-06-10 DIAGNOSIS — M9901 Segmental and somatic dysfunction of cervical region: Secondary | ICD-10-CM | POA: Diagnosis not present

## 2014-06-10 DIAGNOSIS — M546 Pain in thoracic spine: Secondary | ICD-10-CM | POA: Diagnosis not present

## 2014-06-10 DIAGNOSIS — M9902 Segmental and somatic dysfunction of thoracic region: Secondary | ICD-10-CM | POA: Diagnosis not present

## 2014-06-11 DIAGNOSIS — M25521 Pain in right elbow: Secondary | ICD-10-CM | POA: Diagnosis not present

## 2014-06-11 DIAGNOSIS — M25421 Effusion, right elbow: Secondary | ICD-10-CM | POA: Diagnosis not present

## 2014-06-11 DIAGNOSIS — Z6829 Body mass index (BMI) 29.0-29.9, adult: Secondary | ICD-10-CM | POA: Diagnosis not present

## 2014-06-23 DIAGNOSIS — M542 Cervicalgia: Secondary | ICD-10-CM | POA: Diagnosis not present

## 2014-06-23 DIAGNOSIS — M9902 Segmental and somatic dysfunction of thoracic region: Secondary | ICD-10-CM | POA: Diagnosis not present

## 2014-06-23 DIAGNOSIS — M9901 Segmental and somatic dysfunction of cervical region: Secondary | ICD-10-CM | POA: Diagnosis not present

## 2014-06-23 DIAGNOSIS — M546 Pain in thoracic spine: Secondary | ICD-10-CM | POA: Diagnosis not present

## 2014-06-26 DIAGNOSIS — M25521 Pain in right elbow: Secondary | ICD-10-CM | POA: Diagnosis not present

## 2014-06-26 DIAGNOSIS — M7021 Olecranon bursitis, right elbow: Secondary | ICD-10-CM | POA: Diagnosis not present

## 2014-06-26 DIAGNOSIS — M25421 Effusion, right elbow: Secondary | ICD-10-CM | POA: Diagnosis not present

## 2014-06-26 DIAGNOSIS — Z6830 Body mass index (BMI) 30.0-30.9, adult: Secondary | ICD-10-CM | POA: Diagnosis not present

## 2014-06-29 ENCOUNTER — Other Ambulatory Visit: Payer: Self-pay

## 2014-06-29 ENCOUNTER — Encounter (HOSPITAL_COMMUNITY)
Admission: RE | Admit: 2014-06-29 | Discharge: 2014-06-29 | Disposition: A | Discharge status: Home / Self Care | Payer: Medicare Other | Source: Ambulatory Visit | Attending: Orthopaedic Surgery | Admitting: Orthopaedic Surgery

## 2014-06-29 ENCOUNTER — Other Ambulatory Visit: Payer: Self-pay | Admitting: Radiology

## 2014-06-29 ENCOUNTER — Encounter (HOSPITAL_COMMUNITY): Payer: Self-pay

## 2014-06-29 DIAGNOSIS — F17210 Nicotine dependence, cigarettes, uncomplicated: Secondary | ICD-10-CM | POA: Diagnosis not present

## 2014-06-29 DIAGNOSIS — G4730 Sleep apnea, unspecified: Secondary | ICD-10-CM | POA: Diagnosis not present

## 2014-06-29 DIAGNOSIS — M7021 Olecranon bursitis, right elbow: Secondary | ICD-10-CM | POA: Diagnosis not present

## 2014-06-29 DIAGNOSIS — J449 Chronic obstructive pulmonary disease, unspecified: Secondary | ICD-10-CM | POA: Diagnosis not present

## 2014-06-29 DIAGNOSIS — Z96653 Presence of artificial knee joint, bilateral: Secondary | ICD-10-CM | POA: Diagnosis not present

## 2014-06-29 DIAGNOSIS — F329 Major depressive disorder, single episode, unspecified: Secondary | ICD-10-CM | POA: Diagnosis not present

## 2014-06-29 DIAGNOSIS — I10 Essential (primary) hypertension: Secondary | ICD-10-CM | POA: Diagnosis not present

## 2014-06-29 HISTORY — DX: Malignant (primary) neoplasm, unspecified: C80.1

## 2014-06-29 HISTORY — DX: Unspecified macular degeneration: H35.30

## 2014-06-29 LAB — COMPREHENSIVE METABOLIC PANEL
ALK PHOS: 55 U/L (ref 39–117)
ALT: 18 U/L (ref 0–53)
AST: 23 U/L (ref 0–37)
Albumin: 3.9 g/dL (ref 3.5–5.2)
Anion gap: 9 (ref 5–15)
BUN: 15 mg/dL (ref 6–23)
CHLORIDE: 101 mmol/L (ref 96–112)
CO2: 29 mmol/L (ref 19–32)
Calcium: 9.6 mg/dL (ref 8.4–10.5)
Creatinine, Ser: 0.72 mg/dL (ref 0.50–1.35)
GFR calc non Af Amer: 84 mL/min — ABNORMAL LOW (ref >90)
GLUCOSE: 162 mg/dL — AB (ref 70–99)
POTASSIUM: 3.6 mmol/L (ref 3.5–5.1)
Sodium: 139 mmol/L (ref 135–145)
Total Bilirubin: 1.3 mg/dL — ABNORMAL HIGH (ref 0.3–1.2)
Total Protein: 6.5 g/dL (ref 6.0–8.3)

## 2014-06-29 LAB — URINALYSIS, ROUTINE W REFLEX MICROSCOPIC
Bilirubin Urine: NEGATIVE
Glucose, UA: 100 mg/dL — AB
Hgb urine dipstick: NEGATIVE
KETONES UR: NEGATIVE mg/dL
Leukocytes, UA: NEGATIVE
NITRITE: NEGATIVE
PROTEIN: NEGATIVE mg/dL
Specific Gravity, Urine: 1.025 (ref 1.005–1.030)
Urobilinogen, UA: 1 mg/dL (ref 0.0–1.0)
pH: 6 (ref 5.0–8.0)

## 2014-06-29 NOTE — Pre-Procedure Instructions (Signed)
Patient given information to ign up for my chart at  Home.

## 2014-06-29 NOTE — H&P (Signed)
Alexander Young is an 81 y.o. male.   Chief Complaint: right olecranon bursa swelling HPI: He has had swelling of the right olecranon bursa for about six to eight weeks that is bothersome.  He has seen Dr. Willey Blade and had it aspirated.  I have aspirated it several times but it comes back.  It has not been red or infected.  He does not remember any trauma.  X-rays were negative.  I have recommended excision of the bursa as an outpatient.  I have gone over the risks and imponderables of the procedure.  I told him I would put a drain in the elbow and remove it the day after surgery in the office.  He asked appropriate questions.  He appears to understand the procedure.  He asked to do the surgery on 06/30/14.  Past Medical History  Diagnosis Date  . Hypertension   . Depression   . Sleep apnea   . Cough     Past Surgical History  Procedure Laterality Date  . Colonoscopy    . Prostatectomy    . Total knee arthroplasty Bilateral   . Colonoscopy N/A 02/27/2013    Procedure: COLONOSCOPY;  Surgeon: Rogene Houston, MD;  Location: [facility];  Service: Endoscopy;  Laterality: N/A;  730    No family history on file. Social History:  reports that he has been smoking Cigarettes.  He has a 32.5 pack-year smoking history. He does not have any smokeless tobacco history on file. He reports that he drinks alcohol. He reports that he does not use illicit drugs.  Allergies: No Known Allergies  No prescriptions prior to admission    No results found for this or any previous visit (from the past 48 hour(s)). No results found.  Review of Systems  Respiratory:       Smoker.  Cardiovascular:       Hypertension  Musculoskeletal: Positive for joint pain (He has had recurrent swelling of the olecranon bursa on the right).    There were no vitals taken for this visit. Physical Exam  Constitutional: He is oriented to person, place, and time. He appears well-developed and well-nourished.  HENT:  Head:  Normocephalic and atraumatic.  Eyes: Conjunctivae and EOM are normal. Pupils are equal, round, and reactive to light.  Neck: Normal range of motion. Neck supple.  Cardiovascular: Normal rate, regular rhythm, normal heart sounds and intact distal pulses.   Respiratory: Effort normal and breath sounds normal.  GI: Soft.  Musculoskeletal: He exhibits tenderness (He has right olecranon bursa swelling and tenderness with no redness.  He has full ROM of the right elbow.  The bursa is size of golf ball.  NV is intact.).  Neurological: He is alert and oriented to person, place, and time. He has normal reflexes.  Skin: Skin is warm and dry.  Psychiatric: He has a normal mood and affect. His behavior is normal. Judgment and thought content normal.     Assessment/Plan Right olecranon bursitis and swelling of the bursa.  For excision as an outpatient procedure.    Alexander Young 06/29/2014, 12:01 PM

## 2014-06-29 NOTE — Patient Instructions (Signed)
Alexander Young  06/29/2014   Your procedure is scheduled on:  06/30/2014  Report to [facility] at  725  AM.  Call this number if you have problems the morning of surgery: [phone]   Remember:   Do not eat food or drink liquids after midnight.   Take these medicines the morning of surgery with A SIP OF WATER:  Hygronton, allegra, lisinopril   Do not wear jewelry, make-up or nail polish.  Do not wear lotions, powders, or perfumes.   Do not shave 48 hours prior to surgery. Men may shave face and neck.  Do not bring valuables to the hospital.  [facility] is not responsible for any belongings or valuables.               Contacts, dentures or bridgework may not be worn into surgery.  Leave suitcase in the car. After surgery it may be brought to your room.  For patients admitted to the hospital, discharge time is determined by your  treatment team.               Patients discharged the day of surgery will not be allowed to drive home.  Name and phone number of your driver: family  Special Instructions: Shower using CHG 2 nights before surgery and the night before surgery.  If you shower the day of surgery use CHG.  Use special wash - you have one bottle of CHG for all showers.  You should use approximately 1/3 of the bottle for each shower.   Please read over the following fact sheets that you were given: Pain Booklet, Coughing and Deep Breathing, Surgical Site Infection Prevention, Anesthesia Post-op Instructions and Care and Recovery After Surgery Olecranon Bursitis Bursitis is swelling and soreness (inflammation) of a fluid-filled sac (bursa) that covers and protects a joint. Olecranon bursitis occurs over the elbow.  CAUSES Bursitis can be caused by injury, overuse of the joint, arthritis, or infection.  SYMPTOMS   Tenderness, swelling, warmth, or redness over the elbow.  Elbow pain with movement. This is greater with bending the elbow.  Squeaking sound when the bursa is  rubbed or moved.  Increasing size of the bursa without pain or discomfort.  Fever with increasing pain and swelling if the bursa becomes infected. HOME CARE INSTRUCTIONS   Put ice on the affected area.  Put ice in a plastic bag.  Place a towel between your skin and the bag.  Leave the ice on for 15-20 minutes each hour while awake. Do this for the first 2 days.  When resting, elevate your elbow above the level of your heart. This helps reduce swelling.  Continue to put the joint through a full range of motion 4 times per day. Rest the injured joint at other times. When the pain lessens, begin normal slow movements and usual activities.  Only take over-the-counter or prescription medicines for pain, discomfort, or fever as directed by your caregiver.  Reduce your intake of milk and related dairy products (cheese, yogurt). They may make your condition worse. SEEK IMMEDIATE MEDICAL CARE IF:   Your pain increases even during treatment.  You have a fever.  You have heat and inflammation over the bursa and elbow.  You have a red line that goes up your arm.  You have pain with movement of your elbow. MAKE SURE YOU:   Understand these instructions.  Will watch your condition.  Will get help right away if you are  not doing well or get worse. Document Released: 04/12/2006 Document Revised: 06/05/2011 Document Reviewed: 02/26/2007 [facility] Patient Information 2015 [place], [place]. This information is not intended to replace advice given to you by your health care provider. Make sure you discuss any questions you have with your health care provider. PATIENT INSTRUCTIONS POST-ANESTHESIA  IMMEDIATELY FOLLOWING SURGERY:  Do not drive or operate machinery for the first twenty four hours after surgery.  Do not make any important decisions for twenty four hours after surgery or while taking narcotic pain medications or sedatives.  If you develop intractable nausea and vomiting or a severe  headache please notify your doctor immediately.  FOLLOW-UP:  Please make an appointment with your surgeon as instructed. You do not need to follow up with anesthesia unless specifically instructed to do so.  WOUND CARE INSTRUCTIONS (if applicable):  Keep a dry clean dressing on the anesthesia/puncture wound site if there is drainage.  Once the wound has quit draining you may leave it open to air.  Generally you should leave the bandage intact for twenty four hours unless there is drainage.  If the epidural site drains for more than 36-48 hours please call the anesthesia department.  QUESTIONS?:  Please feel free to call your physician or the hospital operator if you have any questions, and they will be happy to assist you.

## 2014-06-30 ENCOUNTER — Encounter (HOSPITAL_COMMUNITY): Payer: Self-pay | Admitting: *Deleted

## 2014-06-30 ENCOUNTER — Ambulatory Visit (HOSPITAL_COMMUNITY): Payer: Medicare Other | Admitting: Anesthesiology

## 2014-06-30 ENCOUNTER — Ambulatory Visit (HOSPITAL_COMMUNITY)
Admission: RE | Admit: 2014-06-30 | Discharge: 2014-06-30 | Disposition: A | Discharge status: Home / Self Care | Payer: Medicare Other | Source: Ambulatory Visit | Attending: Orthopaedic Surgery | Admitting: Orthopaedic Surgery

## 2014-06-30 ENCOUNTER — Encounter (HOSPITAL_COMMUNITY)
Admission: RE | Disposition: A | Discharge status: Home / Self Care | Payer: Self-pay | Source: Ambulatory Visit | Attending: Orthopaedic Surgery

## 2014-06-30 DIAGNOSIS — Z96653 Presence of artificial knee joint, bilateral: Secondary | ICD-10-CM | POA: Diagnosis not present

## 2014-06-30 DIAGNOSIS — J449 Chronic obstructive pulmonary disease, unspecified: Secondary | ICD-10-CM | POA: Insufficient documentation

## 2014-06-30 DIAGNOSIS — M7021 Olecranon bursitis, right elbow: Secondary | ICD-10-CM | POA: Insufficient documentation

## 2014-06-30 DIAGNOSIS — F329 Major depressive disorder, single episode, unspecified: Secondary | ICD-10-CM | POA: Diagnosis not present

## 2014-06-30 DIAGNOSIS — F17210 Nicotine dependence, cigarettes, uncomplicated: Secondary | ICD-10-CM | POA: Insufficient documentation

## 2014-06-30 DIAGNOSIS — G4730 Sleep apnea, unspecified: Secondary | ICD-10-CM | POA: Diagnosis not present

## 2014-06-30 DIAGNOSIS — I10 Essential (primary) hypertension: Secondary | ICD-10-CM | POA: Insufficient documentation

## 2014-06-30 HISTORY — PX: OLECRANON BURSECTOMY: SHX2097

## 2014-06-30 LAB — GLUCOSE, CAPILLARY: Glucose-Capillary: 123 mg/dL — ABNORMAL HIGH (ref 70–99)

## 2014-06-30 SURGERY — BURSECTOMY, ELBOW
Anesthesia: Monitor Anesthesia Care | Site: Elbow | Laterality: Right

## 2014-06-30 MED ORDER — FENTANYL CITRATE 0.05 MG/ML IJ SOLN
INTRAMUSCULAR | Status: DC | PRN
  Administered 2014-06-30 (×4): 25 ug via INTRAVENOUS

## 2014-06-30 MED ORDER — FENTANYL CITRATE 0.05 MG/ML IJ SOLN: 25.0000 ug | INTRAMUSCULAR | Status: DC | PRN

## 2014-06-30 MED ORDER — FENTANYL CITRATE 0.05 MG/ML IJ SOLN
INTRAMUSCULAR | Status: AC
  Filled 2014-06-30: qty 2

## 2014-06-30 MED ORDER — MIDAZOLAM HCL 2 MG/2ML IJ SOLN
1.0000 mg | INTRAMUSCULAR | Status: DC | PRN
  Administered 2014-06-30: 2 mg via INTRAVENOUS

## 2014-06-30 MED ORDER — MIDAZOLAM HCL 2 MG/2ML IJ SOLN
INTRAMUSCULAR | Status: AC
  Filled 2014-06-30: qty 2

## 2014-06-30 MED ORDER — LACTATED RINGERS IV SOLN
INTRAVENOUS | Status: DC
  Administered 2014-06-30: 1000 mL via INTRAVENOUS

## 2014-06-30 MED ORDER — CHLORHEXIDINE GLUCONATE 4 % EX LIQD: 60.0000 mL | Freq: Once | CUTANEOUS | Status: DC

## 2014-06-30 MED ORDER — ONDANSETRON HCL 4 MG/2ML IJ SOLN: 4.0000 mg | Freq: Once | INTRAMUSCULAR | Status: DC | PRN

## 2014-06-30 MED ORDER — FENTANYL CITRATE 0.05 MG/ML IJ SOLN
25.0000 ug | INTRAMUSCULAR | Status: AC
  Administered 2014-06-30 (×2): 25 ug via INTRAVENOUS

## 2014-06-30 MED ORDER — ONDANSETRON HCL 4 MG/2ML IJ SOLN
4.0000 mg | Freq: Once | INTRAMUSCULAR | Status: AC
  Administered 2014-06-30: 4 mg via INTRAVENOUS

## 2014-06-30 MED ORDER — ONDANSETRON HCL 4 MG/2ML IJ SOLN
INTRAMUSCULAR | Status: AC
  Filled 2014-06-30: qty 2

## 2014-06-30 MED ORDER — 0.9 % SODIUM CHLORIDE (POUR BTL) OPTIME
TOPICAL | Status: DC | PRN
  Administered 2014-06-30: 1000 mL

## 2014-06-30 MED ORDER — LIDOCAINE HCL (PF) 1 % IJ SOLN
INTRAMUSCULAR | Status: AC
  Filled 2014-06-30: qty 5

## 2014-06-30 MED ORDER — HYDROCODONE-ACETAMINOPHEN 7.5-325 MG PO TABS: 1.0000 | ORAL_TABLET | ORAL | Status: DC | PRN

## 2014-06-30 MED ORDER — LIDOCAINE HCL (PF) 0.5 % IJ SOLN
INTRAMUSCULAR | Status: AC
  Filled 2014-06-30: qty 50

## 2014-06-30 MED ORDER — LIDOCAINE HCL (PF) 0.5 % IJ SOLN
INTRAMUSCULAR | Status: DC | PRN
  Administered 2014-06-30: 50 mL via INTRAVENOUS

## 2014-06-30 MED ORDER — PROPOFOL INFUSION 10 MG/ML OPTIME
INTRAVENOUS | Status: DC | PRN
  Administered 2014-06-30: 75 ug/kg/min via INTRAVENOUS

## 2014-06-30 SURGICAL SUPPLY — 44 items
BAG HAMPER (MISCELLANEOUS) ×3 IMPLANT
BANDAGE ELASTIC 4 VELCRO NS (GAUZE/BANDAGES/DRESSINGS) ×3 IMPLANT
BANDAGE ELASTIC 4 VELCRO ST LF (GAUZE/BANDAGES/DRESSINGS) ×3 IMPLANT
BANDAGE ELASTIC 6 VELCRO NS (GAUZE/BANDAGES/DRESSINGS) ×3 IMPLANT
BANDAGE ESMARK 4X12 BL STRL LF (DISPOSABLE) ×1 IMPLANT
BLADE SURG 15 STRL LF DISP TIS (BLADE) ×1 IMPLANT
BLADE SURG 15 STRL SS (BLADE) ×3
BNDG CMPR 12X4 ELC STRL LF (DISPOSABLE) ×1
BNDG ESMARK 4X12 BLUE STRL LF (DISPOSABLE) ×3
CLOTH BEACON ORANGE TIMEOUT ST (SAFETY) ×3 IMPLANT
COVER LIGHT HANDLE STERIS (MISCELLANEOUS) ×6 IMPLANT
CUFF TOURNIQUET SINGLE 18IN (TOURNIQUET CUFF) IMPLANT
CUFF TOURNIQUET SINGLE 24IN (TOURNIQUET CUFF) ×3 IMPLANT
DRAIN PENROSE 12X.25 LTX STRL (MISCELLANEOUS) ×3 IMPLANT
DRSG XEROFORM 1X8 (GAUZE/BANDAGES/DRESSINGS) ×3 IMPLANT
DURAPREP 26ML APPLICATOR (WOUND CARE) ×3 IMPLANT
ELECT REM PT RETURN 9FT ADLT (ELECTROSURGICAL) ×3
ELECTRODE REM PT RTRN 9FT ADLT (ELECTROSURGICAL) ×1 IMPLANT
GAUZE SPONGE 4X4 12PLY STRL (GAUZE/BANDAGES/DRESSINGS) ×2 IMPLANT
GAUZE XEROFORM 5X9 LF (GAUZE/BANDAGES/DRESSINGS) IMPLANT
GLOVE BIO SURGEON STRL SZ8 (GLOVE) ×3 IMPLANT
GLOVE BIO SURGEON STRL SZ8.5 (GLOVE) ×3 IMPLANT
GLOVE BIOGEL PI IND STRL 7.0 (GLOVE) ×1 IMPLANT
GLOVE BIOGEL PI IND STRL 7.5 (GLOVE) ×1 IMPLANT
GLOVE BIOGEL PI INDICATOR 7.0 (GLOVE) ×2
GLOVE BIOGEL PI INDICATOR 7.5 (GLOVE) ×2
GLOVE ECLIPSE 6.5 STRL STRAW (GLOVE) ×3 IMPLANT
GOWN STRL REUS W/TWL LRG LVL3 (GOWN DISPOSABLE) ×3 IMPLANT
GOWN STRL REUS W/TWL XL LVL3 (GOWN DISPOSABLE) ×3 IMPLANT
KIT ROOM TURNOVER APOR (KITS) ×3 IMPLANT
MANIFOLD NEPTUNE II (INSTRUMENTS) ×3 IMPLANT
NS IRRIG 1000ML POUR BTL (IV SOLUTION) ×3 IMPLANT
PACK BASIC LIMB (CUSTOM PROCEDURE TRAY) ×3 IMPLANT
PAD ABD 5X9 TENDERSORB (GAUZE/BANDAGES/DRESSINGS) ×18 IMPLANT
PAD ARMBOARD 7.5X6 YLW CONV (MISCELLANEOUS) ×3 IMPLANT
PAD CAST 4YDX4 CTTN HI CHSV (CAST SUPPLIES) ×2 IMPLANT
PADDING CAST COTTON 4X4 STRL (CAST SUPPLIES) ×6
PADDING WEBRIL 4 STERILE (GAUZE/BANDAGES/DRESSINGS) ×3 IMPLANT
SET BASIN LINEN APH (SET/KITS/TRAYS/PACK) ×3 IMPLANT
SPONGE GAUZE 4X4 12PLY (GAUZE/BANDAGES/DRESSINGS) ×3 IMPLANT
SPONGE LAP 18X18 X RAY DECT (DISPOSABLE) ×3 IMPLANT
STAPLER VISISTAT 35W (STAPLE) ×3 IMPLANT
SUT CHROMIC 2 0 CT 1 (SUTURE) ×3 IMPLANT
SUT PLAIN CT 1/2CIR 2-0 27IN (SUTURE) ×3 IMPLANT

## 2014-06-30 NOTE — Brief Op Note (Signed)
06/30/2014  9:48 AM  PATIENT:  Alexander Young  81 y.o. male  PRE-OPERATIVE DIAGNOSIS:  right olecranon bursitis  POST-OPERATIVE DIAGNOSIS:  right olecranon bursitis  PROCEDURE:  Procedure(s): EXCISION RIGHT OLECRANON BURSA (Right)  SURGEON:  Surgeon(s) and Role:    * Sanjuana Kava, MD - Primary  PHYSICIAN ASSISTANT:   ASSISTANTS: none   ANESTHESIA:   regional  EBL:  Total I/O In: 400 [I.V.:400] Out: 0   BLOOD ADMINISTERED:none  DRAINS: Penrose drain in the right elbow area   LOCAL MEDICATIONS USED:  NONE  SPECIMEN:  Source of Specimen:  right olecranon bursa  DISPOSITION OF SPECIMEN:  PATHOLOGY  COUNTS:  YES  TOURNIQUET:   Total Tourniquet Time Documented: Upper Arm (Right) - 38 minutes Total: Upper Arm (Right) - 38 minutes   DICTATION: .Other Dictation: Dictation Number [id number]  PLAN OF CARE: Discharge to home after PACU  PATIENT DISPOSITION:  PACU - hemodynamically stable.   Delay start of Pharmacological VTE agent (>24hrs) due to surgical blood loss or risk of bleeding: not applicable

## 2014-06-30 NOTE — Anesthesia Procedure Notes (Signed)
Procedure Name: MAC Date/Time: 06/30/2014 8:59 AM Performed by: Vista Deck Pre-anesthesia Checklist: Patient identified, Emergency Drugs available, Suction available, Timeout performed and Patient being monitored Patient Re-evaluated:Patient Re-evaluated prior to inductionOxygen Delivery Method: Non-rebreather mask    Anesthesia Regional Block:  Bier block (IV Regional)  Pre-Anesthetic Checklist: ,, timeout performed, Correct Patient, Correct Site, Correct Laterality, Correct Procedure,, site marked, surgical consent,, at surgeon's request  Laterality: Right     Needles:  Injection technique: Single-shot  Needle Type: Other      Needle Gauge: 22 and 22 G    Additional Needles: Bier block (IV Regional)  Nerve Stimulator or Paresthesia:   Additional Responses:  Pulse checked pre and  post tourniquet inflation. Tested to 250 mmhg prior to esmarch exsanguination. IV NSL discontinued post injection. Narrative:  Start time: 06/30/2014 9:06 AM End time: 06/30/2014 9:10 AM  Performed by: Personally

## 2014-06-30 NOTE — Progress Notes (Signed)
The History and Physical is unchanged. I have examined the patient. The patient is medically able to have surgery on the right elbow . Alexander Young

## 2014-06-30 NOTE — Discharge Instructions (Signed)
Keep wound dry on the right elbow.  Use sling.  Take pain medicines as needed.  Keep appointment with Dr. Luna Glasgow tomorrow morning.  Call his office if any problem at [phone] or if after hours, the hospital at [phone].  Incision Care An incision is when a surgeon cuts into your body tissues. After surgery, the incision needs to be cared for properly to prevent infection.  HOME CARE INSTRUCTIONS   Take all medicine as directed by your caregiver. Only take over-the-counter or prescription medicines for pain, discomfort, or fever as directed by your caregiver.  Do not remove your bandage (dressing) or get your incision wet until your surgeon gives you permission. In the event that your dressing becomes wet, dirty, or starts to smell, change the dressing and call your surgeon for instructions as soon as possible.  Take showers. Do not take tub baths, swim, or do anything that may soak the wound until it is healed.  Resume your normal diet and activities as directed or allowed.  Avoid lifting any weight until you are instructed otherwise.  Use anti-itch antihistamine medicine as directed by your caregiver. The wound may itch when it is healing. Do not pick or scratch at the wound.  Follow up with your caregiver for stitch (suture) or staple removal as directed.  Drink enough fluids to keep your urine clear or pale yellow. SEEK MEDICAL CARE IF:   You have redness, swelling, or increasing pain in the wound that is not controlled with medicine.  You have drainage, blood, or pus coming from the wound that lasts longer than 1 day.  You develop muscle aches, chills, or a general ill feeling.  You notice a bad smell coming from the wound or dressing.  Your wound edges separate after the sutures, staples, or skin adhesive strips have been removed.  You develop persistent nausea or vomiting. SEEK IMMEDIATE MEDICAL CARE IF:   You have a fever.  You develop a rash.  You develop  dizzy episodes or faint while standing.  You have difficulty breathing.  You develop any reaction or side effects to medicine given. MAKE SURE YOU:   Understand these instructions.  Will watch your condition.  Will get help right away if you are not doing well or get worse. Document Released: 09/30/2004 Document Revised: 06/05/2011 Document Reviewed: 05/07/2013 [facility] Patient Information 2015 [place], [place]. This information is not intended to replace advice given to you by your health care provider. Make sure you discuss any questions you have with your health care provider.

## 2014-06-30 NOTE — Op Note (Signed)
NAME:  Alexander Young, Alexander Young               ACCOUNT NO.:  [id number]  MEDICAL RECORD NO.:  [id number]  LOCATION:  [facility]                          FACILITY:  [facility]  PHYSICIAN:  J. Sanjuana Kava, M.D. DATE OF BIRTH:  26-Sep-1929  DATE OF PROCEDURE:  06/30/2014 DATE OF DISCHARGE:  06/30/2014                              OPERATIVE REPORT   PREOPERATIVE DIAGNOSIS:  Right olecranon bursitis.  Large right olecranon bursa, not infected.  POSTOPERATIVE DIAGNOSIS:  Right olecranon bursitis.  Large right olecranon bursa, not infected.  PROCEDURE:  Excision of right olecranon bursa.  ANESTHESIA:  Regional block.  SURGEON:  J. Sanjuana Kava, MD.  DRAINS:  One small Penrose drain in the right elbow area.  DRESSING:  Bulky dressing applied.  INDICATIONS:  The patient developed a right olecranon bursitis and irritation of the bursa over the last several months.  He was already seen by Dr. Asencion Noble, his family physician, who aspirated it and I saw the patient.  I have aspirated on 2 separate occasions.  He comes back, and I have told him that it needs to be excised.  He wanted to delay excision, but he has come back, it is bothersome, it hurts and it irritates.  He has had no infection.  He has had no wound.  I discussed with him the risks and imponderables of the procedure.  He elected to have the surgery done today.  He asked appropriate questions and appeared to understand the procedure.  He understands that I will put a Penrose drain in the area, and I will see him back in the office tomorrow to and remove this and then I will follow the wound in the next several weeks and remove the staples in approximately 2 weeks.  He understands that he needs to keep it dry.  He has had limited motion of the elbow.  DESCRIPTION OF PROCEDURE:  The patient was seen in the holding area and the right elbow was identified as the correct surgical site.  I placed a mark over the right olecranon bursa.  He was  brought to the operating room and given a Bier block anesthesia while supine on the operating room table.  Once anesthesia was obtained, the patient was then prepped and draped in the usual manner.  A generalized time-out identifying the patient as Alexander Young and we are doing his right elbow for excision of right olecranon bursa.  All instrumentation was properly positioned and working.  The OR team knew each other.  Outline for incision was made with careful dissection, incision was made directly over the bursa.  The bursa was entered and removed with a large amount of fluid that was clear.  With careful dissection, the bursa was excised from the elbow.  Care of course was taken to avoid any possible injury to the ulnar nerve which was done.  A small stab wound made into the skin on the medial side of the elbow, and then a small Penrose drain inserted.  The wound then reapproximated using 2-0 plain and then skin staples.  Bulky dressing applied.  Sheet cotton applied in several rolls, and Ace bandage applied loosely.  The patient tolerated the  procedure well and will go to recovery in good condition and then will be an outpatient, go home, I will see him tomorrow for removal of the Penrose drain.  Appropriate analgesia will be given for pain.  If any difficulties let me know.          ______________________________ Lenna Sciara. Sanjuana Kava, M.D.     JWK/MEDQ  D:  06/30/2014  T:  06/30/2014  Job:  [id number]  cc:   Paula Compton. Willey Blade, MD Fax: [phone]

## 2014-06-30 NOTE — Anesthesia Postprocedure Evaluation (Signed)
  Anesthesia Post-op Note  Patient: Alexander Young  Procedure(s) Performed: Procedure(s): EXCISION RIGHT OLECRANON BURSA (Right)  Patient Location: PACU  Anesthesia Type:MAC  Level of Consciousness: awake, alert  and patient cooperative  Airway and Oxygen Therapy: Patient Spontanous Breathing  Post-op Pain: mild  Post-op Assessment: Post-op Vital signs reviewed, Patient's Cardiovascular Status Stable, Respiratory Function Stable, Patent Airway and No signs of Nausea or vomiting  Post-op Vital Signs: Reviewed and stable   Complications: No apparent anesthesia complications

## 2014-06-30 NOTE — Anesthesia Preprocedure Evaluation (Signed)
Anesthesia Evaluation  Patient identified by MRN, date of birth, ID band Patient awake    Reviewed: Allergy & Precautions, NPO status , Patient's Chart, lab work & pertinent test results  Airway Mallampati: II  TM Distance: >3 FB     Dental  (+) Teeth Intact, Partial Upper   Pulmonary sleep apnea , COPDCurrent Smoker,  breath sounds clear to auscultation        Cardiovascular hypertension, Pt. on medications Rhythm:Regular Rate:Normal     Neuro/Psych PSYCHIATRIC DISORDERS Depression    GI/Hepatic negative GI ROS,   Endo/Other  diabetes (new onset borderline)  Renal/GU      Musculoskeletal   Abdominal   Peds  Hematology   Anesthesia Other Findings Liquid intake  6oz, 2 hrs ago.  Reproductive/Obstetrics                             Anesthesia Physical Anesthesia Plan  ASA: III  Anesthesia Plan: MAC and Bier Block   Post-op Pain Management:    Induction: Intravenous  Airway Management Planned: Simple Face Mask  Additional Equipment:   Intra-op Plan:   Post-operative Plan:   Informed Consent: I have reviewed the patients History and Physical, chart, labs and discussed the procedure including the risks, benefits and alternatives for the proposed anesthesia with the patient or authorized representative who has indicated his/her understanding and acceptance.     Plan Discussed with:   Anesthesia Plan Comments:         Anesthesia Quick Evaluation

## 2014-06-30 NOTE — Transfer of Care (Signed)
Immediate Anesthesia Transfer of Care Note  Patient: Alexander Young  Procedure(s) Performed: Procedure(s) (LRB): EXCISION RIGHT OLECRANON BURSA (Right)  Patient Location: PACU  Anesthesia Type: MAC  Level of Consciousness: awake  Airway & Oxygen Therapy: Patient Spontanous Breathing. Non Rebreather  Post-op Assessment: Report given to PACU RN, Post -op Vital signs reviewed and stable and Patient moving all extremities  Post vital signs: Reviewed and stable  Complications: No apparent anesthesia complications

## 2014-07-02 ENCOUNTER — Encounter (HOSPITAL_COMMUNITY): Payer: Self-pay | Admitting: Orthopaedic Surgery

## 2014-07-02 DIAGNOSIS — H35052 Retinal neovascularization, unspecified, left eye: Secondary | ICD-10-CM | POA: Diagnosis not present

## 2014-07-02 DIAGNOSIS — H3532 Exudative age-related macular degeneration: Secondary | ICD-10-CM | POA: Diagnosis not present

## 2014-07-10 DIAGNOSIS — M546 Pain in thoracic spine: Secondary | ICD-10-CM | POA: Diagnosis not present

## 2014-07-10 DIAGNOSIS — M9901 Segmental and somatic dysfunction of cervical region: Secondary | ICD-10-CM | POA: Diagnosis not present

## 2014-07-10 DIAGNOSIS — M9902 Segmental and somatic dysfunction of thoracic region: Secondary | ICD-10-CM | POA: Diagnosis not present

## 2014-07-10 DIAGNOSIS — M542 Cervicalgia: Secondary | ICD-10-CM | POA: Diagnosis not present

## 2014-07-14 DIAGNOSIS — M7021 Olecranon bursitis, right elbow: Secondary | ICD-10-CM | POA: Diagnosis not present

## 2014-07-16 ENCOUNTER — Inpatient Hospital Stay (HOSPITAL_COMMUNITY): Admission: RE | Admit: 2014-07-16 | Payer: Medicare Other | Source: Ambulatory Visit

## 2014-07-16 ENCOUNTER — Ambulatory Visit (HOSPITAL_COMMUNITY)
Admission: RE | Admit: 2014-07-16 | Discharge: 2014-07-16 | Disposition: A | Discharge status: Home / Self Care | Payer: Medicare Other | Source: Ambulatory Visit | Attending: Orthopaedic Surgery | Admitting: Orthopaedic Surgery

## 2014-07-16 ENCOUNTER — Ambulatory Visit (HOSPITAL_COMMUNITY): Admission: RE | Admit: 2014-07-16 | Payer: Medicare Other | Source: Ambulatory Visit

## 2014-07-16 DIAGNOSIS — L03119 Cellulitis of unspecified part of limb: Secondary | ICD-10-CM | POA: Diagnosis not present

## 2014-07-16 LAB — COMPREHENSIVE METABOLIC PANEL
ALT: 23 U/L (ref 0–53)
ANION GAP: 8 (ref 5–15)
AST: 27 U/L (ref 0–37)
Albumin: 3.6 g/dL (ref 3.5–5.2)
Alkaline Phosphatase: 66 U/L (ref 39–117)
BILIRUBIN TOTAL: 0.7 mg/dL (ref 0.3–1.2)
BUN: 20 mg/dL (ref 6–23)
CHLORIDE: 94 mmol/L — AB (ref 96–112)
CO2: 34 mmol/L — AB (ref 19–32)
CREATININE: 0.86 mg/dL (ref 0.50–1.35)
Calcium: 9.2 mg/dL (ref 8.4–10.5)
GFR calc Af Amer: >90 mL/min (ref >90)
GFR, EST NON AFRICAN AMERICAN: 78 mL/min — AB (ref >90)
Glucose, Bld: 203 mg/dL — ABNORMAL HIGH (ref 70–99)
Potassium: 3.5 mmol/L (ref 3.5–5.1)
Sodium: 136 mmol/L (ref 135–145)
Total Protein: 6.5 g/dL (ref 6.0–8.3)

## 2014-07-16 LAB — CBC
HCT: 48.6 % (ref 39.0–52.0)
Hemoglobin: 16.4 g/dL (ref 13.0–17.0)
MCH: 32.5 pg (ref 26.0–34.0)
MCHC: 33.7 g/dL (ref 30.0–36.0)
MCV: 96.2 fL (ref 78.0–100.0)
PLATELETS: 319 10*3/uL (ref 150–400)
RBC: 5.05 MIL/uL (ref 4.22–5.81)
RDW: 13.3 % (ref 11.5–15.5)
WBC: 10.6 10*3/uL — ABNORMAL HIGH (ref 4.0–10.5)

## 2014-07-16 LAB — DIFFERENTIAL
BASOS ABS: 0 10*3/uL (ref 0.0–0.1)
Basophils Relative: 0 % (ref 0–1)
EOS PCT: 1 % (ref 0–5)
Eosinophils Absolute: 0.1 10*3/uL (ref 0.0–0.7)
Lymphocytes Relative: 28 % (ref 12–46)
Lymphs Abs: 2.9 10*3/uL (ref 0.7–4.0)
MONO ABS: 0.9 10*3/uL (ref 0.1–1.0)
MONOS PCT: 9 % (ref 3–12)
NEUTROS ABS: 6.7 10*3/uL (ref 1.7–7.7)
NEUTROS PCT: 62 % (ref 43–77)

## 2014-07-16 MED ORDER — SODIUM CHLORIDE 0.9 % IJ SOLN: 10.0000 mL | Freq: Two times a day (BID) | INTRAMUSCULAR | Status: DC

## 2014-07-16 MED ORDER — SODIUM CHLORIDE 0.9 % IJ SOLN
10.0000 mL | INTRAMUSCULAR | Status: DC | PRN
  Administered 2014-07-16: 20 mL
  Filled 2014-07-16: qty 40

## 2014-07-16 MED ORDER — VANCOMYCIN HCL 10 G IV SOLR
1750.0000 mg | INTRAVENOUS | Status: DC
  Administered 2014-07-16: 1750 mg via INTRAVENOUS
  Filled 2014-07-16: qty 1750

## 2014-07-16 NOTE — Progress Notes (Signed)
ANTIBIOTIC CONSULT NOTE - INITIAL  Pharmacy Consult for Vancomycin Indication: Cellulitis  No Known Allergies  Patient Measurements: Height: 5\' 11"  (180.3 cm) Weight: 200 lb (90.719 kg) IBW/kg (Calculated) : 75.3  Labs:  Recent Labs  07/16/14 1400  WBC 10.6*  HGB 16.4  PLT 319  CREATININE 0.86   Estimated Creatinine Clearance: 75 mL/min (by C-G formula based on Cr of 0.86). No results for input(s): VANCOTROUGH, VANCOPEAK, VANCORANDOM, GENTTROUGH, GENTPEAK, GENTRANDOM, TOBRATROUGH, TOBRAPEAK, TOBRARND, AMIKACINPEAK, AMIKACINTROU, AMIKACIN in the last 72 hours.   Assessment: Pharmacy Vancomycin protocol, cellulitis, for 10 days  Goal of Therapy:  Vancomycin trough level 10-15 mcg/ml  Plan:  Vancomycin 1750 mg IV every 24 hours Monitor renal function Vancomycin trough at steady [address], Alexander Young 07/16/2014,3:45 PM

## 2014-07-16 NOTE — Progress Notes (Signed)
Peripherally Inserted Central Catheter/Midline Placement  The IV Nurse has discussed with the patient and/or persons authorized to consent for the patient, the purpose of this procedure and the potential benefits and risks involved with this procedure.  The benefits include less needle sticks, lab draws from the catheter and patient may be discharged home with the catheter.  Risks include, but not limited to, infection, bleeding, blood clot (thrombus formation), and puncture of an artery; nerve damage and irregular heat beat.  Alternatives to this procedure were also discussed.  PICC/Midline Placement Documentation  PICC / Midline Single Lumen 03/52/48 PICC Left Basilic 46 cm 0 cm (Active)  Indication for Insertion or Continuance of Line Prolonged intravenous therapies 07/16/2014  3:02 PM  Exposed Catheter (cm) 0 cm 07/16/2014  3:02 PM  Site Assessment Clean;Dry;Intact 07/16/2014  3:02 PM  Line Status Flushed;Saline locked;Blood return noted 07/16/2014  3:02 PM  Dressing Type Transparent;Securing device 07/16/2014  3:02 PM  Dressing Status Clean;Dry;Intact;Antimicrobial disc in place 07/16/2014  3:02 PM  Line Care Connections checked and tightened 07/16/2014  3:02 PM  Dressing Intervention New dressing 07/16/2014  3:02 PM  Dressing Change Due 07/23/14 07/16/2014  3:02 PM    Tip confirmed in SVC by ECG technology.  PICC inserted by Charm Barges, RN.   Alexander Young 07/16/2014, 3:03 PM

## 2014-07-16 NOTE — Discharge Instructions (Signed)
Peripherally Inserted Central Catheter/Midline Placement  PICC/Midline Placement Documentation  PICC / Midline Single Lumen 53/97/67 PICC Left Basilic 46 cm 0 cm (Active)  Indication for Insertion or Continuance of Line Prolonged intravenous therapies 07/16/2014  3:02 PM  Exposed Catheter (cm) 0 cm 07/16/2014  3:02 PM  Site Assessment Clean;Dry;Intact 07/16/2014  3:02 PM  Line Status Flushed;Saline locked;Blood return noted 07/16/2014  3:02 PM  Dressing Type Transparent;Securing device 07/16/2014  3:02 PM  Dressing Status Clean;Dry;Intact;Antimicrobial disc in place 07/16/2014  3:02 PM  Line Care Connections checked and tightened 07/16/2014  3:02 PM  Dressing Intervention New dressing 07/16/2014  3:02 PM  Dressing Change Due 07/23/14 07/16/2014  3:02 PM     PICC Insertion, Care After Refer to this sheet in the next few weeks. These instructions provide you with information on caring for yourself after your procedure. Your health care provider may also give you more specific instructions. Your treatment has been planned according to current medical practices, but problems sometimes occur. Call your health care provider if you have any problems or questions after your procedure. WHAT TO EXPECT AFTER THE PROCEDURE After your procedure, it is typical to have the following:  Mild discomfort at the insertion site. This should not last more than a day. HOME CARE INSTRUCTIONS  Rest at home for the remainder of the day after the procedure.  You may bend your arm and move it freely. If your PICC is near or at the bend of your elbow, avoid activity with repeated motion at the elbow.  Avoid lifting heavy objects as instructed by your health care provider.  Avoid using a crutch with the arm on the same side as your PICC. You may need to use a walker. Bandage Care  Keep your PICC bandage (dressing) clean and dry to prevent infection.  Ask your health care provider when you may shower. To keep the  dressing dry, cover the PICC with plastic wrap and tape before showering. If the dressing does become wet, replace it right after the shower.  Do not soak in the bath, swim, or use hot tubs when you have a PICC.  Change the PICC dressing as instructed by your health care provider.  Change your PICC dressing if it becomes loose or wet. General PICC Care  Check the PICC insertion site daily for leakage, redness, swelling, or pain.  Flush the PICC as directed by your health care provider. Let your health care provider know right away if the PICC is difficult to flush or does not flush. Do not use force to flush the PICC.  Do not use a syringe that is less than 10 mL to flush the PICC.  Never pull or tug on the PICC.  Avoid blood pressure checks on the arm with the PICC.  Keep your PICC identification card with you at all times.  Do not take the PICC out yourself. Only a trained health care professional should remove the PICC. SEEK MEDICAL CARE IF:  You have pain in your arm, ear, face, or teeth.  You have fever or chills.  You have drainage from the PICC insertion site.  You have redness or palpate a "cord" around the PICC insertion site.  You cannot flush the catheter. SEEK IMMEDIATE MEDICAL CARE IF:  You have swelling in the arm in which the PICC is inserted. Document Released: 01/01/2013 Document Revised: 03/18/2013 Document Reviewed: 01/01/2013 [facility] Patient Information 2015 [place], [place]. This information is not intended to replace advice given to  you by your health care provider. Make sure you discuss any questions you have with your health care provider.

## 2014-07-17 ENCOUNTER — Encounter (HOSPITAL_COMMUNITY)
Admission: RE | Admit: 2014-07-17 | Discharge: 2014-07-17 | Disposition: A | Discharge status: Home / Self Care | Payer: Medicare Other | Source: Ambulatory Visit | Attending: Orthopaedic Surgery | Admitting: Orthopaedic Surgery

## 2014-07-17 DIAGNOSIS — L03119 Cellulitis of unspecified part of limb: Secondary | ICD-10-CM | POA: Diagnosis not present

## 2014-07-17 MED ORDER — VANCOMYCIN HCL 10 G IV SOLR
1750.0000 mg | Freq: Once | INTRAVENOUS | Status: DC
  Filled 2014-07-17: qty 1750

## 2014-07-17 MED ORDER — VANCOMYCIN HCL 10 G IV SOLR
1750.0000 mg | Freq: Once | INTRAVENOUS | Status: AC
  Administered 2014-07-17: 1750 mg via INTRAVENOUS
  Filled 2014-07-17: qty 1750

## 2014-07-17 MED ORDER — SODIUM CHLORIDE 0.9 % IV SOLN
INTRAVENOUS | Status: DC
  Administered 2014-07-17: 13:00:00 via INTRAVENOUS

## 2014-07-18 ENCOUNTER — Encounter (HOSPITAL_COMMUNITY)
Admission: RE | Admit: 2014-07-18 | Discharge: 2014-07-18 | Disposition: A | Discharge status: Home / Self Care | Payer: Medicare Other | Source: Ambulatory Visit | Attending: Orthopaedic Surgery | Admitting: Orthopaedic Surgery

## 2014-07-18 DIAGNOSIS — L03119 Cellulitis of unspecified part of limb: Secondary | ICD-10-CM | POA: Diagnosis not present

## 2014-07-19 ENCOUNTER — Encounter (HOSPITAL_COMMUNITY)
Admission: RE | Admit: 2014-07-19 | Discharge: 2014-07-19 | Disposition: A | Discharge status: Home / Self Care | Payer: Medicare Other | Source: Ambulatory Visit | Attending: Orthopaedic Surgery | Admitting: Orthopaedic Surgery

## 2014-07-19 DIAGNOSIS — L03119 Cellulitis of unspecified part of limb: Secondary | ICD-10-CM | POA: Diagnosis not present

## 2014-07-20 ENCOUNTER — Encounter (HOSPITAL_COMMUNITY)
Admission: RE | Admit: 2014-07-20 | Discharge: 2014-07-20 | Disposition: A | Discharge status: Home / Self Care | Payer: Medicare Other | Source: Ambulatory Visit | Attending: Orthopaedic Surgery | Admitting: Orthopaedic Surgery

## 2014-07-20 ENCOUNTER — Encounter (HOSPITAL_COMMUNITY): Payer: Self-pay

## 2014-07-20 DIAGNOSIS — L03119 Cellulitis of unspecified part of limb: Secondary | ICD-10-CM | POA: Diagnosis not present

## 2014-07-20 LAB — BASIC METABOLIC PANEL
ANION GAP: 9 (ref 5–15)
BUN: 17 mg/dL (ref 6–23)
CO2: 31 mmol/L (ref 19–32)
CREATININE: 0.7 mg/dL (ref 0.50–1.35)
Calcium: 9.4 mg/dL (ref 8.4–10.5)
Chloride: 98 mmol/L (ref 96–112)
GFR calc Af Amer: >90 mL/min (ref >90)
GFR calc non Af Amer: 85 mL/min — ABNORMAL LOW (ref >90)
Glucose, Bld: 158 mg/dL — ABNORMAL HIGH (ref 70–99)
Potassium: 3.4 mmol/L — ABNORMAL LOW (ref 3.5–5.1)
Sodium: 138 mmol/L (ref 135–145)

## 2014-07-20 LAB — CBC
HEMATOCRIT: 46.6 % (ref 39.0–52.0)
Hemoglobin: 15.9 g/dL (ref 13.0–17.0)
MCH: 32 pg (ref 26.0–34.0)
MCHC: 34.1 g/dL (ref 30.0–36.0)
MCV: 93.8 fL (ref 78.0–100.0)
Platelets: 288 10*3/uL (ref 150–400)
RBC: 4.97 MIL/uL (ref 4.22–5.81)
RDW: 13.1 % (ref 11.5–15.5)
WBC: 11.5 10*3/uL — ABNORMAL HIGH (ref 4.0–10.5)

## 2014-07-20 LAB — VANCOMYCIN, TROUGH: VANCOMYCIN TR: 5.7 ug/mL — AB (ref 10.0–20.0)

## 2014-07-20 MED ORDER — VANCOMYCIN HCL 10 G IV SOLR
1750.0000 mg | INTRAVENOUS | Status: DC
  Administered 2014-07-20: 1750 mg via INTRAVENOUS
  Filled 2014-07-20: qty 1750

## 2014-07-20 NOTE — Progress Notes (Signed)
ANTIBIOTIC CONSULT NOTE  Pharmacy Consult for Vancomycin Indication: Cellulitis  No Known Allergies  Patient Measurements: Height: 5\' 11"  (180.3 cm) Weight: 200 lb (90.719 kg) IBW/kg (Calculated) : 75.3  Labs: 4/25:  Scr 0.7 (est CrCl ~ 52ml/min)             Vancomycin trough 5.7 mcg/ml (goal 10-15)          Assessment: 80 yo M on day# 5 Vancomycin for cellulitis.  Per RN report, there has been minimal improvement and MD has extended treatment course to 12 days.   Vancomycin trough is below desired goal range.  Renal function remains at patient's baseline.   Goal of Therapy:  Vancomycin trough level 10-15 mcg/ml  Plan:  Increase Vancomycin 2500 mg IV every 24 hours Monitor weekly Scr while on Vancomycin Repeat Vancomycin trough at steady state *May need to consider alternate medication or BID Vancomycin dosing to achieve adequate trough levels if patient continues not to improve.   Netta Cedars, PharmD, BCPS 07/20/2014@4 :32 PM

## 2014-07-20 NOTE — Progress Notes (Signed)
Results for Alexander Young, Alexander Young (MRN [id number]) as of 07/20/2014 15:23  Mondays CBC, requested labs per pharmacy  BMET along with Vancomycin trough done as well.  Ref. Range 07/20/2014 13:00  Sodium Latest Ref Range: 135-145 mmol/L 138  Potassium Latest Ref Range: 3.5-5.1 mmol/L 3.4 (L)  Chloride Latest Ref Range: 96-112 mmol/L 98  CO2 Latest Ref Range: 19-32 mmol/L 31  BUN Latest Ref Range: 6-23 mg/dL 17  Creatinine Latest Ref Range: 0.50-1.35 mg/dL 0.70  Calcium Latest Ref Range: 8.4-10.5 mg/dL 9.4  EGFR (Non-African Amer.) Latest Ref Range: >90 mL/min 85 (L)  EGFR (African American) Latest Ref Range: >90 mL/min >90  Glucose Latest Ref Range: 70-99 mg/dL 158 (H)  Anion gap Latest Ref Range: 5-15  9  WBC Latest Ref Range: 4.0-10.5 K/uL 11.5 (H)  RBC Latest Ref Range: 4.22-5.81 MIL/uL 4.97  Hemoglobin Latest Ref Range: 13.0-17.0 g/dL 15.9  HCT Latest Ref Range: 39.0-52.0 % 46.6  MCV Latest Ref Range: 78.0-100.0 fL 93.8  MCH Latest Ref Range: 26.0-34.0 pg 32.0  MCHC Latest Ref Range: 30.0-36.0 g/dL 34.1  RDW Latest Ref Range: 11.5-15.5 % 13.1  Platelets Latest Ref Range: 150-400 K/uL 288  Vancomycin Tr Latest Ref Range: 10.0-20.0 ug/mL 5.7 (L)

## 2014-07-21 ENCOUNTER — Encounter (HOSPITAL_COMMUNITY)
Admission: RE | Admit: 2014-07-21 | Discharge: 2014-07-21 | Disposition: A | Discharge status: Home / Self Care | Payer: Medicare Other | Source: Ambulatory Visit | Attending: Orthopaedic Surgery | Admitting: Orthopaedic Surgery

## 2014-07-21 DIAGNOSIS — L03119 Cellulitis of unspecified part of limb: Secondary | ICD-10-CM | POA: Diagnosis not present

## 2014-07-21 MED ORDER — VANCOMYCIN HCL 10 G IV SOLR
2500.0000 mg | INTRAVENOUS | Status: DC
  Administered 2014-07-21: 2500 mg via INTRAVENOUS
  Filled 2014-07-21: qty 2500

## 2014-07-21 NOTE — Progress Notes (Signed)
Here for vancomycin infusion. Dx cellulitis.

## 2014-07-21 NOTE — Progress Notes (Signed)
Wants drsg to right elbow changed. Talked with Dr Luna Glasgow. Okay to leave dressing off or can change drsg if needed. No ointment required.

## 2014-07-21 NOTE — Progress Notes (Signed)
drsg to PICC changed. PICC site without redness or edema. No drainage present. Sterile drsg reapplied. Tolerated well.

## 2014-07-21 NOTE — Progress Notes (Signed)
drsg to right elbow d/c'd. Incision intact and without drainage. Area red and without edema. Pt elects to keep drsg off. Tolerated well.

## 2014-07-22 ENCOUNTER — Encounter (HOSPITAL_COMMUNITY)
Admission: RE | Admit: 2014-07-22 | Discharge: 2014-07-22 | Disposition: A | Discharge status: Home / Self Care | Payer: Medicare Other | Source: Ambulatory Visit | Attending: Orthopaedic Surgery | Admitting: Orthopaedic Surgery

## 2014-07-22 ENCOUNTER — Encounter (HOSPITAL_COMMUNITY): Payer: Self-pay

## 2014-07-22 DIAGNOSIS — L03119 Cellulitis of unspecified part of limb: Secondary | ICD-10-CM | POA: Diagnosis not present

## 2014-07-22 MED ORDER — VANCOMYCIN HCL 10 G IV SOLR
2500.0000 mg | Freq: Once | INTRAVENOUS | Status: AC
  Administered 2014-07-22: 2500 mg via INTRAVENOUS
  Filled 2014-07-22: qty 2500

## 2014-07-22 MED ORDER — SODIUM CHLORIDE 0.9 % IV SOLN
Freq: Once | INTRAVENOUS | Status: AC
  Administered 2014-07-22: 13:00:00 via INTRAVENOUS

## 2014-07-23 ENCOUNTER — Encounter (HOSPITAL_COMMUNITY)
Admission: RE | Admit: 2014-07-23 | Discharge: 2014-07-23 | Disposition: A | Discharge status: Home / Self Care | Payer: Medicare Other | Source: Ambulatory Visit | Attending: Orthopaedic Surgery | Admitting: Orthopaedic Surgery

## 2014-07-23 DIAGNOSIS — L03119 Cellulitis of unspecified part of limb: Secondary | ICD-10-CM | POA: Diagnosis not present

## 2014-07-23 LAB — CBC
HCT: 46.3 % (ref 39.0–52.0)
HEMOGLOBIN: 15.8 g/dL (ref 13.0–17.0)
MCH: 32.2 pg (ref 26.0–34.0)
MCHC: 34.1 g/dL (ref 30.0–36.0)
MCV: 94.5 fL (ref 78.0–100.0)
Platelets: 271 10*3/uL (ref 150–400)
RBC: 4.9 MIL/uL (ref 4.22–5.81)
RDW: 13.3 % (ref 11.5–15.5)
WBC: 11.3 10*3/uL — ABNORMAL HIGH (ref 4.0–10.5)

## 2014-07-23 MED ORDER — SODIUM CHLORIDE 0.9 % IJ SOLN: 10.0000 mL | Freq: Once | INTRAMUSCULAR | Status: DC

## 2014-07-23 MED ORDER — HEPARIN SOD (PORK) LOCK FLUSH 100 UNIT/ML IV SOLN
500.0000 [IU] | Freq: Once | INTRAVENOUS | Status: DC
  Filled 2014-07-23: qty 5

## 2014-07-23 MED ORDER — VANCOMYCIN HCL 10 G IV SOLR
2500.0000 mg | INTRAVENOUS | Status: DC
  Administered 2014-07-23: 2500 mg via INTRAVENOUS
  Filled 2014-07-23: qty 2500

## 2014-07-24 ENCOUNTER — Encounter (HOSPITAL_COMMUNITY)
Admission: RE | Admit: 2014-07-24 | Discharge: 2014-07-24 | Disposition: A | Discharge status: Home / Self Care | Payer: Medicare Other | Source: Ambulatory Visit | Attending: Orthopaedic Surgery | Admitting: Orthopaedic Surgery

## 2014-07-24 ENCOUNTER — Encounter (HOSPITAL_COMMUNITY): Payer: Self-pay

## 2014-07-24 DIAGNOSIS — L03119 Cellulitis of unspecified part of limb: Secondary | ICD-10-CM | POA: Diagnosis not present

## 2014-07-24 MED ORDER — VANCOMYCIN HCL 10 G IV SOLR
2500.0000 mg | Freq: Once | INTRAVENOUS | Status: DC
  Filled 2014-07-24: qty 2500

## 2014-07-24 MED ORDER — SODIUM CHLORIDE 0.9 % IJ SOLN: 10.0000 mL | Freq: Once | INTRAMUSCULAR | Status: DC

## 2014-07-24 MED ORDER — HEPARIN SOD (PORK) LOCK FLUSH 100 UNIT/ML IV SOLN: 500.0000 [IU] | Freq: Once | INTRAVENOUS | Status: DC

## 2014-07-24 MED ORDER — VANCOMYCIN HCL 10 G IV SOLR
2500.0000 mg | INTRAVENOUS | Status: DC
  Administered 2014-07-24: 2500 mg via INTRAVENOUS
  Filled 2014-07-24: qty 2500

## 2014-07-24 MED ORDER — SODIUM CHLORIDE 0.9 % IV SOLN
Freq: Once | INTRAVENOUS | Status: AC
  Administered 2014-07-24: 13:00:00 via INTRAVENOUS

## 2014-07-25 ENCOUNTER — Encounter (HOSPITAL_COMMUNITY)
Admission: RE | Admit: 2014-07-25 | Discharge: 2014-07-25 | Disposition: A | Discharge status: Home / Self Care | Payer: Medicare Other | Source: Ambulatory Visit | Attending: Orthopaedic Surgery | Admitting: Orthopaedic Surgery

## 2014-07-25 DIAGNOSIS — L03119 Cellulitis of unspecified part of limb: Secondary | ICD-10-CM | POA: Diagnosis not present

## 2014-07-26 ENCOUNTER — Encounter (HOSPITAL_COMMUNITY)
Admission: RE | Admit: 2014-07-26 | Discharge: 2014-07-26 | Disposition: A | Discharge status: Home / Self Care | Payer: Medicare Other | Source: Ambulatory Visit | Attending: Orthopaedic Surgery | Admitting: Orthopaedic Surgery

## 2014-07-26 DIAGNOSIS — L0390 Cellulitis, unspecified: Secondary | ICD-10-CM | POA: Insufficient documentation

## 2014-07-27 ENCOUNTER — Encounter (HOSPITAL_COMMUNITY)
Admission: RE | Admit: 2014-07-27 | Discharge: 2014-07-27 | Disposition: A | Discharge status: Home / Self Care | Payer: Medicare Other | Source: Ambulatory Visit | Attending: Orthopaedic Surgery | Admitting: Orthopaedic Surgery

## 2014-07-27 ENCOUNTER — Encounter (HOSPITAL_COMMUNITY): Payer: Medicare Other

## 2014-07-27 DIAGNOSIS — L0390 Cellulitis, unspecified: Secondary | ICD-10-CM | POA: Diagnosis not present

## 2014-07-27 LAB — CBC
HEMATOCRIT: 44.4 % (ref 39.0–52.0)
Hemoglobin: 15 g/dL (ref 13.0–17.0)
MCH: 31.6 pg (ref 26.0–34.0)
MCHC: 33.8 g/dL (ref 30.0–36.0)
MCV: 93.7 fL (ref 78.0–100.0)
PLATELETS: 288 10*3/uL (ref 150–400)
RBC: 4.74 MIL/uL (ref 4.22–5.81)
RDW: 13 % (ref 11.5–15.5)
WBC: 11.8 10*3/uL — AB (ref 4.0–10.5)

## 2014-07-27 MED ORDER — SODIUM CHLORIDE 0.9 % IV SOLN: Freq: Once | INTRAVENOUS | Status: DC

## 2014-07-27 MED ORDER — SODIUM CHLORIDE 0.9 % IV SOLN
Freq: Once | INTRAVENOUS | Status: AC
  Administered 2014-07-27: 250 mL via INTRAVENOUS

## 2014-07-27 MED ORDER — VANCOMYCIN HCL 10 G IV SOLR
2500.0000 mg | Freq: Once | INTRAVENOUS | Status: DC
  Filled 2014-07-27: qty 2500

## 2014-07-27 MED ORDER — VANCOMYCIN HCL 10 G IV SOLR
2500.0000 mg | Freq: Once | INTRAVENOUS | Status: AC
  Administered 2014-07-27: 2500 mg via INTRAVENOUS
  Filled 2014-07-27: qty 2500

## 2014-07-27 MED FILL — Vancomycin HCl For IV Soln 10 GM (Base Equivalent): INTRAVENOUS | Qty: 1750 | Status: AC

## 2014-07-28 NOTE — Progress Notes (Signed)
Results for ATHA, MCBAIN (MRN [id number]) as of 07/28/2014 16:07  Ref. Range 07/27/2014 13:00  WBC Latest Ref Range: 4.0-10.5 K/uL 11.8 (H)  RBC Latest Ref Range: 4.22-5.81 MIL/uL 4.74  Hemoglobin Latest Ref Range: 13.0-17.0 g/dL 15.0  HCT Latest Ref Range: 39.0-52.0 % 44.4  MCV Latest Ref Range: 78.0-100.0 fL 93.7  MCH Latest Ref Range: 26.0-34.0 pg 31.6  MCHC Latest Ref Range: 30.0-36.0 g/dL 33.8  RDW Latest Ref Range: 11.5-15.5 % 13.0  Platelets Latest Ref Range: 150-400 K/uL 288

## 2014-08-10 ENCOUNTER — Ambulatory Visit (HOSPITAL_COMMUNITY): Payer: Medicare Other | Admitting: Physical Therapy

## 2014-08-12 DIAGNOSIS — H3532 Exudative age-related macular degeneration: Secondary | ICD-10-CM | POA: Diagnosis not present

## 2014-08-17 DIAGNOSIS — M1990 Unspecified osteoarthritis, unspecified site: Secondary | ICD-10-CM | POA: Diagnosis not present

## 2014-08-17 DIAGNOSIS — Z79899 Other long term (current) drug therapy: Secondary | ICD-10-CM | POA: Diagnosis not present

## 2014-08-17 DIAGNOSIS — R7301 Impaired fasting glucose: Secondary | ICD-10-CM | POA: Diagnosis not present

## 2014-08-17 DIAGNOSIS — I10 Essential (primary) hypertension: Secondary | ICD-10-CM | POA: Diagnosis not present

## 2014-08-25 DIAGNOSIS — C61 Malignant neoplasm of prostate: Secondary | ICD-10-CM | POA: Diagnosis not present

## 2014-08-25 DIAGNOSIS — I10 Essential (primary) hypertension: Secondary | ICD-10-CM | POA: Diagnosis not present

## 2014-08-25 DIAGNOSIS — G4730 Sleep apnea, unspecified: Secondary | ICD-10-CM | POA: Diagnosis not present

## 2014-09-25 DIAGNOSIS — H3532 Exudative age-related macular degeneration: Secondary | ICD-10-CM | POA: Diagnosis not present

## 2014-10-02 DIAGNOSIS — Z8546 Personal history of malignant neoplasm of prostate: Secondary | ICD-10-CM | POA: Diagnosis not present

## 2014-10-02 DIAGNOSIS — N5231 Erectile dysfunction following radical prostatectomy: Secondary | ICD-10-CM | POA: Diagnosis not present

## 2014-10-15 DIAGNOSIS — I10 Essential (primary) hypertension: Secondary | ICD-10-CM | POA: Diagnosis not present

## 2014-10-15 DIAGNOSIS — Z6829 Body mass index (BMI) 29.0-29.9, adult: Secondary | ICD-10-CM | POA: Diagnosis not present

## 2014-10-15 DIAGNOSIS — G2581 Restless legs syndrome: Secondary | ICD-10-CM | POA: Diagnosis not present

## 2014-10-15 DIAGNOSIS — G4730 Sleep apnea, unspecified: Secondary | ICD-10-CM | POA: Diagnosis not present

## 2014-10-26 ENCOUNTER — Ambulatory Visit (INDEPENDENT_AMBULATORY_CARE_PROVIDER_SITE_OTHER): Payer: Medicare Other | Admitting: Pulmonary Disease

## 2014-10-26 ENCOUNTER — Encounter: Payer: Self-pay | Admitting: Pulmonary Disease

## 2014-10-26 VITALS — BP 134/72 | HR 85 | Ht 70.0 in | Wt 205.0 lb

## 2014-10-26 DIAGNOSIS — G4733 Obstructive sleep apnea (adult) (pediatric): Secondary | ICD-10-CM

## 2014-10-26 NOTE — Patient Instructions (Signed)
Will arrange for home sleep study Will call to arrange for follow up after sleep study reviewed  

## 2014-10-26 NOTE — Progress Notes (Deleted)
   Subjective:    Patient ID: Alexander Young, male    DOB: 12-10-1929, 81 y.o.   MRN: [id number]  HPI    Review of Systems  Constitutional: Negative for fever and unexpected weight change.  HENT: Positive for congestion. Negative for dental problem, ear pain, nosebleeds, postnasal drip, rhinorrhea, sinus pressure, sneezing, sore throat and trouble swallowing.   Eyes: Negative for redness and itching.  Respiratory: Negative for cough, chest tightness, shortness of breath and wheezing.   Cardiovascular: Negative for palpitations and leg swelling.  Gastrointestinal: Negative for nausea and vomiting.  Genitourinary: Negative for dysuria.  Musculoskeletal: Negative for joint swelling.  Skin: Negative for rash.  Neurological: Negative for headaches.  Hematological: Does not bruise/bleed easily.  Psychiatric/Behavioral: Negative for dysphoric mood. The patient is not nervous/anxious.        Objective:   Physical Exam        Assessment & Plan:

## 2014-10-26 NOTE — Progress Notes (Signed)
Chief Complaint  Patient presents with  . SLEEP CONSULT    Referred by Dr Willey Blade. Former [facility] patient - Epworth Score: 0    History of Present Illness: Alexander Young is a 81 y.o. male for evaluation of sleep problems.  He has hx of sleep apnea.  He was tried on CPAP but had trouble with mask fit.  He had UPPP, but this didn't help.    He is widowed.  He was told before that he snored and stopped breathing.  He feels more tired during the day and it is a struggle to get going in the morning.   He goes to sleep at 10 pm.  He falls asleep 5.  He wakes up 3 to 5 times to use the bathroom.  He gets out of bed at 630 am.  He feels tired in the morning.  He denies morning headache.  He does not use anything to help him fall sleep or stay awake.  He denies sleep walking, sleep talking, bruxism, or nightmares.  There is no history of restless legs.  He denies sleep hallucinations, sleep paralysis, or cataplexy.  The Epworth score is 0 out of 24.  Tests: PSG 06/23/05 >> AHI 86, SpO2 low 73%  Alexander Young  has a past medical history of Hypertension; Depression; Cough; Macular degeneration; Sleep apnea; and Cancer.  Alexander Young  has past surgical history that includes Colonoscopy; Prostatectomy; Total knee arthroplasty (Bilateral); Colonoscopy (N/A, 02/27/2013); Olecranon bursectomy (Right, 06/30/2014); Uvulopalatopharyngoplasty; and Septoplasty.  Prior to Admission medications   Medication Sig Start Date End Date Taking? Authorizing Provider  chlorthalidone (HYGROTON) 25 MG tablet Take 25 mg by mouth daily.   Yes Historical Provider, MD  Fexofenadine-Pseudoephedrine (ALLEGRA-D PO) Take 1 tablet by mouth every other day.   Yes Historical Provider, MD  lisinopril (PRINIVIL,ZESTRIL) 20 MG tablet Take 20 mg by mouth daily.     Yes Historical Provider, MD    No Known Allergies  His family history includes Colon cancer in his father.  He  reports that he has been smoking Cigarettes.  He has a  32.5 pack-year smoking history. He does not have any smokeless tobacco history on file. He reports that he drinks alcohol. He reports that he does not use illicit drugs.  Review of Systems  Constitutional: Negative for fever and unexpected weight change.  HENT: Positive for congestion. Negative for dental problem, ear pain, nosebleeds, postnasal drip, rhinorrhea, sinus pressure, sneezing, sore throat and trouble swallowing.   Eyes: Negative for redness and itching.  Respiratory: Negative for cough, chest tightness, shortness of breath and wheezing.   Cardiovascular: Negative for palpitations and leg swelling.  Gastrointestinal: Negative for nausea and vomiting.  Genitourinary: Negative for dysuria.  Musculoskeletal: Negative for joint swelling.  Skin: Negative for rash.  Neurological: Negative for headaches.  Hematological: Does not bruise/bleed easily.  Psychiatric/Behavioral: Negative for dysphoric mood. The patient is not nervous/anxious.    Physical Exam: BP 134/72 mmHg  Pulse 85  Ht 5\' 10"  (1.778 m)  Wt 205 lb (92.987 kg)  BMI 29.41 kg/m2  SpO2 94%  General - No distress ENT - No sinus tenderness, no oral exudate, no LAN, no thyromegaly, TM clear, pupils equal/reactive, s/p UPPP Cardiac - s1s2 regular, no murmur, pulses symmetric Chest - No wheeze/rales/dullness, good air entry, normal respiratory excursion Back - No focal tenderness Abd - Soft, non-tender, no organomegaly, + bowel sounds Ext - No edema Neuro - Normal strength, cranial nerves intact Skin -  No rashes Psych - Normal mood, and behavior  Discussion: He has snoring, sleep disruption, and daytime sleepiness.  He has prior hx of sleep apnea.  He has hx of HTN and depression.  I am concerned he could still have sleep apnea.  We discussed how sleep apnea can affect various health problems including risks for hypertension, cardiovascular disease, and diabetes.  We also discussed how sleep disruption can increase  risks for accident, such as while driving.  Weight loss as a means of improving sleep apnea was also reviewed.  Additional treatment options discussed were CPAP therapy, oral appliance, and surgical intervention.   Assessment/plan:  Obstructive sleep apnea. Plan: - will arrange for home sleep study pending insurance approval   Chesley Mires, M.D. Pager [phone]

## 2014-10-30 DIAGNOSIS — H3532 Exudative age-related macular degeneration: Secondary | ICD-10-CM | POA: Diagnosis not present

## 2014-11-05 ENCOUNTER — Ambulatory Visit (INDEPENDENT_AMBULATORY_CARE_PROVIDER_SITE_OTHER): Payer: Medicare Other | Admitting: Otolaryngology

## 2014-11-05 DIAGNOSIS — H903 Sensorineural hearing loss, bilateral: Secondary | ICD-10-CM

## 2014-11-05 DIAGNOSIS — H6123 Impacted cerumen, bilateral: Secondary | ICD-10-CM

## 2014-11-05 DIAGNOSIS — H6983 Other specified disorders of Eustachian tube, bilateral: Secondary | ICD-10-CM

## 2014-11-10 DIAGNOSIS — G4733 Obstructive sleep apnea (adult) (pediatric): Secondary | ICD-10-CM | POA: Diagnosis not present

## 2014-11-11 ENCOUNTER — Telehealth: Payer: Self-pay | Admitting: Pulmonary Disease

## 2014-11-11 DIAGNOSIS — G4733 Obstructive sleep apnea (adult) (pediatric): Secondary | ICD-10-CM | POA: Diagnosis not present

## 2014-11-11 NOTE — Telephone Encounter (Signed)
HST 11/10/14 >> AHI 14.8, SaO2 low 76%.  Poor respiratory tracing in last 1/3 of study.  Will have my nurse inform pt that sleep study shows moderate sleep apnea.  He needs ROV to review tx options >> can be with me or Tammy Parrett.

## 2014-11-12 ENCOUNTER — Other Ambulatory Visit: Payer: Self-pay | Admitting: *Deleted

## 2014-11-12 DIAGNOSIS — G4733 Obstructive sleep apnea (adult) (pediatric): Secondary | ICD-10-CM

## 2014-11-12 NOTE — Telephone Encounter (Signed)
Results have been explained to patient, pt expressed understanding.  Patient scheduled for OV with VS to discuss tx options 11/18/14 at 1:30. Nothing further needed.

## 2014-11-18 ENCOUNTER — Ambulatory Visit (INDEPENDENT_AMBULATORY_CARE_PROVIDER_SITE_OTHER): Payer: Medicare Other | Admitting: Pulmonary Disease

## 2014-11-18 ENCOUNTER — Encounter: Payer: Self-pay | Admitting: Pulmonary Disease

## 2014-11-18 VITALS — BP 134/70 | HR 89 | Ht 70.0 in | Wt 203.0 lb

## 2014-11-18 DIAGNOSIS — G4733 Obstructive sleep apnea (adult) (pediatric): Secondary | ICD-10-CM

## 2014-11-18 DIAGNOSIS — Z9989 Dependence on other enabling machines and devices: Principal | ICD-10-CM

## 2014-11-18 NOTE — Patient Instructions (Signed)
Will arrange for CPAP set up Follow up in 2 months 

## 2014-11-18 NOTE — Progress Notes (Signed)
Chief Complaint  Patient presents with  . Follow-up    Pt here to discuss sleep study from 11/10/14. Pt states breathing is unchanged since last OV.     History of Present Illness: Alexander Young is a 81 y.o. male with OSA.  He is here to review his sleep study.  This showed mild sleep apnea, but there was significant artifact in last 1/3 of study >> sleep apnea likely worse.  He is still waking up frequently at night, snoring and feeling tired during the day.   TESTS: PSG 06/23/05 >> AHI 86, SpO2 low 73% HST 11/10/14 >> AHI 14.8, SaO2 low 76%.  Poor respiratory tracing in last 1/3 of study.  Past medical hx >> HTN, Depression, Macular degeneration, Prostate Cancer  Past surgical hx, Medications, Allergies, Family hx, Social hx all reviewed.   Physical Exam: BP 134/70 mmHg  Pulse 89  Ht 5\' 10"  (1.778 m)  Wt 203 lb (92.08 kg)  BMI 29.13 kg/m2  SpO2 95%  General - No distress ENT - No sinus tenderness, no oral exudate, no LAN, s/p UPPP Cardiac - s1s2 regular, no murmur Chest - No wheeze/rales/dullness Back - No focal tenderness Abd - Soft, non-tender Ext - No edema Neuro - Normal strength Skin - No rashes Psych - normal mood, and behavior  Discussion: His sleep study shows mild to moderate sleep apnea.  I have reviewed the recent sleep study results with the patient.  We discussed how sleep apnea can affect various health problems including risks for hypertension, cardiovascular disease, and diabetes.  We also discussed how sleep disruption can increase risks for accident, such as while driving.  Weight loss as a means of improving sleep apnea was also reviewed.  Additional treatment options discussed were CPAP therapy, oral appliance, and surgical intervention.  Assessment/Plan:  Obstructive sleep apnea. Plan: - will arrange for auto CPAP set up   Chesley Mires, MD [facility] Pager:  [phone]

## 2014-11-20 DIAGNOSIS — N390 Urinary tract infection, site not specified: Secondary | ICD-10-CM | POA: Diagnosis not present

## 2014-11-20 DIAGNOSIS — N3001 Acute cystitis with hematuria: Secondary | ICD-10-CM | POA: Diagnosis not present

## 2014-11-20 DIAGNOSIS — Z6829 Body mass index (BMI) 29.0-29.9, adult: Secondary | ICD-10-CM | POA: Diagnosis not present

## 2014-12-04 DIAGNOSIS — H3532 Exudative age-related macular degeneration: Secondary | ICD-10-CM | POA: Diagnosis not present

## 2015-01-06 DIAGNOSIS — M542 Cervicalgia: Secondary | ICD-10-CM | POA: Diagnosis not present

## 2015-01-06 DIAGNOSIS — M9902 Segmental and somatic dysfunction of thoracic region: Secondary | ICD-10-CM | POA: Diagnosis not present

## 2015-01-06 DIAGNOSIS — M546 Pain in thoracic spine: Secondary | ICD-10-CM | POA: Diagnosis not present

## 2015-01-06 DIAGNOSIS — M9901 Segmental and somatic dysfunction of cervical region: Secondary | ICD-10-CM | POA: Diagnosis not present

## 2015-01-08 DIAGNOSIS — M542 Cervicalgia: Secondary | ICD-10-CM | POA: Diagnosis not present

## 2015-01-08 DIAGNOSIS — M9902 Segmental and somatic dysfunction of thoracic region: Secondary | ICD-10-CM | POA: Diagnosis not present

## 2015-01-08 DIAGNOSIS — M546 Pain in thoracic spine: Secondary | ICD-10-CM | POA: Diagnosis not present

## 2015-01-08 DIAGNOSIS — M9901 Segmental and somatic dysfunction of cervical region: Secondary | ICD-10-CM | POA: Diagnosis not present

## 2015-01-11 DIAGNOSIS — M9901 Segmental and somatic dysfunction of cervical region: Secondary | ICD-10-CM | POA: Diagnosis not present

## 2015-01-11 DIAGNOSIS — M542 Cervicalgia: Secondary | ICD-10-CM | POA: Diagnosis not present

## 2015-01-11 DIAGNOSIS — M9902 Segmental and somatic dysfunction of thoracic region: Secondary | ICD-10-CM | POA: Diagnosis not present

## 2015-01-11 DIAGNOSIS — M546 Pain in thoracic spine: Secondary | ICD-10-CM | POA: Diagnosis not present

## 2015-01-13 DIAGNOSIS — M542 Cervicalgia: Secondary | ICD-10-CM | POA: Diagnosis not present

## 2015-01-13 DIAGNOSIS — H353221 Exudative age-related macular degeneration, left eye, with active choroidal neovascularization: Secondary | ICD-10-CM | POA: Diagnosis not present

## 2015-01-13 DIAGNOSIS — M546 Pain in thoracic spine: Secondary | ICD-10-CM | POA: Diagnosis not present

## 2015-01-13 DIAGNOSIS — M9902 Segmental and somatic dysfunction of thoracic region: Secondary | ICD-10-CM | POA: Diagnosis not present

## 2015-01-13 DIAGNOSIS — M9901 Segmental and somatic dysfunction of cervical region: Secondary | ICD-10-CM | POA: Diagnosis not present

## 2015-01-15 DIAGNOSIS — M9901 Segmental and somatic dysfunction of cervical region: Secondary | ICD-10-CM | POA: Diagnosis not present

## 2015-01-15 DIAGNOSIS — M542 Cervicalgia: Secondary | ICD-10-CM | POA: Diagnosis not present

## 2015-01-15 DIAGNOSIS — M9902 Segmental and somatic dysfunction of thoracic region: Secondary | ICD-10-CM | POA: Diagnosis not present

## 2015-01-15 DIAGNOSIS — M546 Pain in thoracic spine: Secondary | ICD-10-CM | POA: Diagnosis not present

## 2015-01-19 DIAGNOSIS — M9902 Segmental and somatic dysfunction of thoracic region: Secondary | ICD-10-CM | POA: Diagnosis not present

## 2015-01-19 DIAGNOSIS — M542 Cervicalgia: Secondary | ICD-10-CM | POA: Diagnosis not present

## 2015-01-19 DIAGNOSIS — M546 Pain in thoracic spine: Secondary | ICD-10-CM | POA: Diagnosis not present

## 2015-01-19 DIAGNOSIS — M9901 Segmental and somatic dysfunction of cervical region: Secondary | ICD-10-CM | POA: Diagnosis not present

## 2015-01-21 ENCOUNTER — Encounter: Payer: Self-pay | Admitting: Pulmonary Disease

## 2015-01-21 ENCOUNTER — Ambulatory Visit (INDEPENDENT_AMBULATORY_CARE_PROVIDER_SITE_OTHER): Payer: Medicare Other | Admitting: Pulmonary Disease

## 2015-01-21 VITALS — BP 116/60 | HR 76 | Temp 97.9°F | Ht 70.0 in | Wt 201.0 lb

## 2015-01-21 DIAGNOSIS — G4733 Obstructive sleep apnea (adult) (pediatric): Secondary | ICD-10-CM | POA: Diagnosis not present

## 2015-01-21 MED ORDER — FLUTICASONE PROPIONATE 50 MCG/ACT NA SUSP: 2.0000 | Freq: Every day | NASAL | Status: AC

## 2015-01-21 NOTE — Patient Instructions (Signed)
Flonase two sprays in each nostril daily for 2 weeks, then as needed  Talk to your dentist about an oral appliance (mandibular advancement device) as a therapy for obstructive sleep apnea  Follow up in 4 months

## 2015-01-21 NOTE — Progress Notes (Signed)
Chief Complaint  Patient presents with  . Follow-up    Pt following for OSA:  pt states CPAP didnt really work he returned it tuesday. pt states he wakes often throughput the night. most of the time he sleeps in a recliner.     History of Present Illness: Alexander Young is a 81 y.o. male with OSA.  He tried CPAP.  He had full face mask.  He could sleep up to 4 hours with machine, but then would take it off.  He has been getting sinus congestion and this makes it hard for him to use CPAP.  He will sleep in recliner, and this helps some.  He is still waking up frequently during the night.  He turned his CPAP in.   TESTS: PSG 06/23/05 >> AHI 86, SpO2 low 73% HST 11/10/14 >> AHI 14.8, SaO2 low 76%.  Poor respiratory tracing in last 1/3 of study. Auto CPAP 12/19/14 to 01/17/15 >> used on 17 of 30 nights with average 3 hrs and 21 min.  Average AHI is 1.2 with median CPAP 9 cm H2O and 95 th percentile CPAP 12 cm H20.  Past medical hx >> HTN, Depression, Macular degeneration, Prostate Cancer  Past surgical hx, Medications, Allergies, Family hx, Social hx all reviewed.   Physical Exam: BP 116/60 mmHg  Pulse 76  Temp(Src) 97.9 F (36.6 C) (Oral)  Ht 5\' 10"  (1.778 m)  Wt 201 lb (91.173 kg)  BMI 28.84 kg/m2  SpO2 93%  General - No distress ENT - No sinus tenderness, no oral exudate, no LAN, s/p UPPP Cardiac - s1s2 regular, no murmur Chest - No wheeze/rales/dullness Back - No focal tenderness Abd - Soft, non-tender Ext - No edema Neuro - Normal strength Skin - No rashes Psych - normal mood, and behavior  Discussion: He has not been tolerant of CPAP.  It seems his main issue with CPAP therapy is related to nasal congestion.  He would not want to try CPAP again even if his sinus symptoms improve.  Assessment/Plan:  Obstructive sleep apnea. Plan: - he will d/w his dentist about whether he is a candidate for oral appliance  Nasal congestion. Plan: - will have him try using  flonase   Chesley Mires, MD [facility]/Sleep Pager:  [phone]

## 2015-01-22 DIAGNOSIS — Z23 Encounter for immunization: Secondary | ICD-10-CM | POA: Diagnosis not present

## 2015-01-26 DIAGNOSIS — M9902 Segmental and somatic dysfunction of thoracic region: Secondary | ICD-10-CM | POA: Diagnosis not present

## 2015-01-26 DIAGNOSIS — M542 Cervicalgia: Secondary | ICD-10-CM | POA: Diagnosis not present

## 2015-01-26 DIAGNOSIS — M9901 Segmental and somatic dysfunction of cervical region: Secondary | ICD-10-CM | POA: Diagnosis not present

## 2015-01-26 DIAGNOSIS — M546 Pain in thoracic spine: Secondary | ICD-10-CM | POA: Diagnosis not present

## 2015-01-29 DIAGNOSIS — M9902 Segmental and somatic dysfunction of thoracic region: Secondary | ICD-10-CM | POA: Diagnosis not present

## 2015-01-29 DIAGNOSIS — M546 Pain in thoracic spine: Secondary | ICD-10-CM | POA: Diagnosis not present

## 2015-01-29 DIAGNOSIS — M9901 Segmental and somatic dysfunction of cervical region: Secondary | ICD-10-CM | POA: Diagnosis not present

## 2015-01-29 DIAGNOSIS — M542 Cervicalgia: Secondary | ICD-10-CM | POA: Diagnosis not present

## 2015-02-01 DIAGNOSIS — M9901 Segmental and somatic dysfunction of cervical region: Secondary | ICD-10-CM | POA: Diagnosis not present

## 2015-02-01 DIAGNOSIS — M9902 Segmental and somatic dysfunction of thoracic region: Secondary | ICD-10-CM | POA: Diagnosis not present

## 2015-02-01 DIAGNOSIS — M546 Pain in thoracic spine: Secondary | ICD-10-CM | POA: Diagnosis not present

## 2015-02-01 DIAGNOSIS — M542 Cervicalgia: Secondary | ICD-10-CM | POA: Diagnosis not present

## 2015-02-05 DIAGNOSIS — M9902 Segmental and somatic dysfunction of thoracic region: Secondary | ICD-10-CM | POA: Diagnosis not present

## 2015-02-05 DIAGNOSIS — M9901 Segmental and somatic dysfunction of cervical region: Secondary | ICD-10-CM | POA: Diagnosis not present

## 2015-02-05 DIAGNOSIS — M542 Cervicalgia: Secondary | ICD-10-CM | POA: Diagnosis not present

## 2015-02-05 DIAGNOSIS — M546 Pain in thoracic spine: Secondary | ICD-10-CM | POA: Diagnosis not present

## 2015-02-10 DIAGNOSIS — M546 Pain in thoracic spine: Secondary | ICD-10-CM | POA: Diagnosis not present

## 2015-02-10 DIAGNOSIS — M9902 Segmental and somatic dysfunction of thoracic region: Secondary | ICD-10-CM | POA: Diagnosis not present

## 2015-02-10 DIAGNOSIS — M9901 Segmental and somatic dysfunction of cervical region: Secondary | ICD-10-CM | POA: Diagnosis not present

## 2015-02-10 DIAGNOSIS — M542 Cervicalgia: Secondary | ICD-10-CM | POA: Diagnosis not present

## 2015-02-26 DIAGNOSIS — Z6828 Body mass index (BMI) 28.0-28.9, adult: Secondary | ICD-10-CM | POA: Diagnosis not present

## 2015-02-26 DIAGNOSIS — E119 Type 2 diabetes mellitus without complications: Secondary | ICD-10-CM | POA: Diagnosis not present

## 2015-02-26 DIAGNOSIS — J449 Chronic obstructive pulmonary disease, unspecified: Secondary | ICD-10-CM | POA: Diagnosis not present

## 2015-02-26 DIAGNOSIS — F329 Major depressive disorder, single episode, unspecified: Secondary | ICD-10-CM | POA: Diagnosis not present

## 2015-03-01 DIAGNOSIS — E119 Type 2 diabetes mellitus without complications: Secondary | ICD-10-CM | POA: Diagnosis not present

## 2015-03-03 DIAGNOSIS — H353221 Exudative age-related macular degeneration, left eye, with active choroidal neovascularization: Secondary | ICD-10-CM | POA: Diagnosis not present

## 2015-04-05 DIAGNOSIS — Z6829 Body mass index (BMI) 29.0-29.9, adult: Secondary | ICD-10-CM | POA: Diagnosis not present

## 2015-04-05 DIAGNOSIS — J449 Chronic obstructive pulmonary disease, unspecified: Secondary | ICD-10-CM | POA: Diagnosis not present

## 2015-04-05 DIAGNOSIS — F329 Major depressive disorder, single episode, unspecified: Secondary | ICD-10-CM | POA: Diagnosis not present

## 2015-04-28 DIAGNOSIS — H35352 Cystoid macular degeneration, left eye: Secondary | ICD-10-CM | POA: Diagnosis not present

## 2015-04-28 DIAGNOSIS — H3570 Unspecified separation of retinal layers: Secondary | ICD-10-CM | POA: Diagnosis not present

## 2015-04-28 DIAGNOSIS — H353221 Exudative age-related macular degeneration, left eye, with active choroidal neovascularization: Secondary | ICD-10-CM | POA: Diagnosis not present

## 2015-04-28 DIAGNOSIS — H353133 Nonexudative age-related macular degeneration, bilateral, advanced atrophic without subfoveal involvement: Secondary | ICD-10-CM | POA: Diagnosis not present

## 2015-05-06 ENCOUNTER — Ambulatory Visit (INDEPENDENT_AMBULATORY_CARE_PROVIDER_SITE_OTHER): Payer: Medicare Other | Admitting: Otolaryngology

## 2015-05-06 DIAGNOSIS — J310 Chronic rhinitis: Secondary | ICD-10-CM

## 2015-05-06 DIAGNOSIS — H01022 Squamous blepharitis right lower eyelid: Secondary | ICD-10-CM | POA: Diagnosis not present

## 2015-05-06 DIAGNOSIS — H6123 Impacted cerumen, bilateral: Secondary | ICD-10-CM | POA: Diagnosis not present

## 2015-05-06 DIAGNOSIS — Z961 Presence of intraocular lens: Secondary | ICD-10-CM | POA: Diagnosis not present

## 2015-05-06 DIAGNOSIS — H6983 Other specified disorders of Eustachian tube, bilateral: Secondary | ICD-10-CM

## 2015-05-06 DIAGNOSIS — H01024 Squamous blepharitis left upper eyelid: Secondary | ICD-10-CM | POA: Diagnosis not present

## 2015-05-06 DIAGNOSIS — H01025 Squamous blepharitis left lower eyelid: Secondary | ICD-10-CM | POA: Diagnosis not present

## 2015-05-06 DIAGNOSIS — D3131 Benign neoplasm of right choroid: Secondary | ICD-10-CM | POA: Diagnosis not present

## 2015-05-06 DIAGNOSIS — H01021 Squamous blepharitis right upper eyelid: Secondary | ICD-10-CM | POA: Diagnosis not present

## 2015-05-06 DIAGNOSIS — H10413 Chronic giant papillary conjunctivitis, bilateral: Secondary | ICD-10-CM | POA: Diagnosis not present

## 2015-05-18 DIAGNOSIS — Z6829 Body mass index (BMI) 29.0-29.9, adult: Secondary | ICD-10-CM | POA: Diagnosis not present

## 2015-05-18 DIAGNOSIS — G4733 Obstructive sleep apnea (adult) (pediatric): Secondary | ICD-10-CM | POA: Diagnosis not present

## 2015-05-18 DIAGNOSIS — F329 Major depressive disorder, single episode, unspecified: Secondary | ICD-10-CM | POA: Diagnosis not present

## 2015-05-25 ENCOUNTER — Ambulatory Visit: Payer: Medicare Other | Admitting: Pulmonary Disease

## 2015-06-09 DIAGNOSIS — H353221 Exudative age-related macular degeneration, left eye, with active choroidal neovascularization: Secondary | ICD-10-CM | POA: Diagnosis not present

## 2015-06-09 DIAGNOSIS — H353133 Nonexudative age-related macular degeneration, bilateral, advanced atrophic without subfoveal involvement: Secondary | ICD-10-CM | POA: Diagnosis not present

## 2015-07-21 DIAGNOSIS — H43822 Vitreomacular adhesion, left eye: Secondary | ICD-10-CM | POA: Diagnosis not present

## 2015-07-21 DIAGNOSIS — H353221 Exudative age-related macular degeneration, left eye, with active choroidal neovascularization: Secondary | ICD-10-CM | POA: Diagnosis not present

## 2015-07-21 DIAGNOSIS — H35352 Cystoid macular degeneration, left eye: Secondary | ICD-10-CM | POA: Diagnosis not present

## 2015-08-25 DIAGNOSIS — H353221 Exudative age-related macular degeneration, left eye, with active choroidal neovascularization: Secondary | ICD-10-CM | POA: Diagnosis not present

## 2015-08-31 DIAGNOSIS — R7301 Impaired fasting glucose: Secondary | ICD-10-CM | POA: Diagnosis not present

## 2015-08-31 DIAGNOSIS — F329 Major depressive disorder, single episode, unspecified: Secondary | ICD-10-CM | POA: Diagnosis not present

## 2015-08-31 DIAGNOSIS — Z79899 Other long term (current) drug therapy: Secondary | ICD-10-CM | POA: Diagnosis not present

## 2015-08-31 DIAGNOSIS — E119 Type 2 diabetes mellitus without complications: Secondary | ICD-10-CM | POA: Diagnosis not present

## 2015-08-31 DIAGNOSIS — I10 Essential (primary) hypertension: Secondary | ICD-10-CM | POA: Diagnosis not present

## 2015-08-31 DIAGNOSIS — C61 Malignant neoplasm of prostate: Secondary | ICD-10-CM | POA: Diagnosis not present

## 2015-08-31 DIAGNOSIS — Z125 Encounter for screening for malignant neoplasm of prostate: Secondary | ICD-10-CM | POA: Diagnosis not present

## 2015-09-07 DIAGNOSIS — I10 Essential (primary) hypertension: Secondary | ICD-10-CM | POA: Diagnosis not present

## 2015-09-07 DIAGNOSIS — E119 Type 2 diabetes mellitus without complications: Secondary | ICD-10-CM | POA: Diagnosis not present

## 2015-09-07 DIAGNOSIS — Z8546 Personal history of malignant neoplasm of prostate: Secondary | ICD-10-CM | POA: Diagnosis not present

## 2015-09-07 DIAGNOSIS — Z6829 Body mass index (BMI) 29.0-29.9, adult: Secondary | ICD-10-CM | POA: Diagnosis not present

## 2015-09-17 DIAGNOSIS — G2581 Restless legs syndrome: Secondary | ICD-10-CM | POA: Diagnosis not present

## 2015-10-04 DIAGNOSIS — N5231 Erectile dysfunction following radical prostatectomy: Secondary | ICD-10-CM | POA: Diagnosis not present

## 2015-10-04 DIAGNOSIS — Z8546 Personal history of malignant neoplasm of prostate: Secondary | ICD-10-CM | POA: Diagnosis not present

## 2015-10-06 DIAGNOSIS — H353221 Exudative age-related macular degeneration, left eye, with active choroidal neovascularization: Secondary | ICD-10-CM | POA: Diagnosis not present

## 2015-11-05 DIAGNOSIS — G4733 Obstructive sleep apnea (adult) (pediatric): Secondary | ICD-10-CM | POA: Diagnosis not present

## 2015-11-05 DIAGNOSIS — G2581 Restless legs syndrome: Secondary | ICD-10-CM | POA: Diagnosis not present

## 2015-11-29 ENCOUNTER — Encounter (HOSPITAL_COMMUNITY): Payer: Self-pay | Admitting: Emergency Medicine

## 2015-11-29 ENCOUNTER — Emergency Department (HOSPITAL_COMMUNITY)
Admission: EM | Admit: 2015-11-29 | Discharge: 2015-11-29 | Discharge status: Against Medical Advice | Payer: Medicare Other | Attending: Emergency Medicine | Admitting: Emergency Medicine

## 2015-11-29 ENCOUNTER — Emergency Department (HOSPITAL_COMMUNITY): Payer: Medicare Other

## 2015-11-29 DIAGNOSIS — I10 Essential (primary) hypertension: Secondary | ICD-10-CM | POA: Insufficient documentation

## 2015-11-29 DIAGNOSIS — F17210 Nicotine dependence, cigarettes, uncomplicated: Secondary | ICD-10-CM | POA: Diagnosis not present

## 2015-11-29 DIAGNOSIS — Z8546 Personal history of malignant neoplasm of prostate: Secondary | ICD-10-CM | POA: Diagnosis not present

## 2015-11-29 DIAGNOSIS — R52 Pain, unspecified: Secondary | ICD-10-CM | POA: Diagnosis not present

## 2015-11-29 DIAGNOSIS — R5382 Chronic fatigue, unspecified: Secondary | ICD-10-CM | POA: Insufficient documentation

## 2015-11-29 DIAGNOSIS — Z79899 Other long term (current) drug therapy: Secondary | ICD-10-CM | POA: Insufficient documentation

## 2015-11-29 DIAGNOSIS — J449 Chronic obstructive pulmonary disease, unspecified: Secondary | ICD-10-CM | POA: Diagnosis not present

## 2015-11-29 DIAGNOSIS — S41109A Unspecified open wound of unspecified upper arm, initial encounter: Secondary | ICD-10-CM | POA: Diagnosis not present

## 2015-11-29 LAB — CBC WITH DIFFERENTIAL/PLATELET
Basophils Absolute: 0 10*3/uL (ref 0.0–0.1)
Basophils Relative: 0 %
Eosinophils Absolute: 0 10*3/uL (ref 0.0–0.7)
Eosinophils Relative: 0 %
HEMATOCRIT: 48.4 % (ref 39.0–52.0)
Hemoglobin: 17 g/dL (ref 13.0–17.0)
Lymphocytes Relative: 21 %
Lymphs Abs: 1.9 10*3/uL (ref 0.7–4.0)
MCH: 32.6 pg (ref 26.0–34.0)
MCHC: 35.1 g/dL (ref 30.0–36.0)
MCV: 92.9 fL (ref 78.0–100.0)
MONOS PCT: 8 %
Monocytes Absolute: 0.8 10*3/uL (ref 0.1–1.0)
Neutro Abs: 6.7 10*3/uL (ref 1.7–7.7)
Neutrophils Relative %: 71 %
Platelets: 219 10*3/uL (ref 150–400)
RBC: 5.21 MIL/uL (ref 4.22–5.81)
RDW: 13.4 % (ref 11.5–15.5)
WBC: 9.5 10*3/uL (ref 4.0–10.5)

## 2015-11-29 LAB — URINALYSIS, ROUTINE W REFLEX MICROSCOPIC
BILIRUBIN URINE: NEGATIVE
Glucose, UA: NEGATIVE mg/dL
Hgb urine dipstick: NEGATIVE
Ketones, ur: NEGATIVE mg/dL
Leukocytes, UA: NEGATIVE
NITRITE: NEGATIVE
PH: 6 (ref 5.0–8.0)
Protein, ur: NEGATIVE mg/dL
SPECIFIC GRAVITY, URINE: 1.01 (ref 1.005–1.030)

## 2015-11-29 LAB — COMPREHENSIVE METABOLIC PANEL
ALBUMIN: 4 g/dL (ref 3.5–5.0)
ALT: 16 U/L — ABNORMAL LOW (ref 17–63)
ANION GAP: 10 (ref 5–15)
AST: 26 U/L (ref 15–41)
Alkaline Phosphatase: 47 U/L (ref 38–126)
BUN: 13 mg/dL (ref 6–20)
CO2: 29 mmol/L (ref 22–32)
Calcium: 9.8 mg/dL (ref 8.9–10.3)
Chloride: 95 mmol/L — ABNORMAL LOW (ref 101–111)
Creatinine, Ser: 0.84 mg/dL (ref 0.61–1.24)
GFR calc Af Amer: >60 mL/min (ref >60)
GFR calc non Af Amer: >60 mL/min (ref >60)
GLUCOSE: 129 mg/dL — AB (ref 65–99)
POTASSIUM: 3.9 mmol/L (ref 3.5–5.1)
Sodium: 134 mmol/L — ABNORMAL LOW (ref 135–145)
Total Bilirubin: 0.8 mg/dL (ref 0.3–1.2)
Total Protein: 6.5 g/dL (ref 6.5–8.1)

## 2015-11-29 LAB — TROPONIN I: Troponin I: <0.03 ng/mL (ref <0.03)

## 2015-11-29 LAB — CK: Total CK: 59 U/L (ref 49–397)

## 2015-11-29 NOTE — ED Provider Notes (Signed)
[facility] DEPT Provider Note   CSN: [id number] Arrival date & time: 11/29/15  1459     History   Chief Complaint Chief Complaint  Patient presents with  . Generalized Body Aches    HPI Alexander Young is a 82 y.o. male.  HPI  Pt was seen at 1525. Per pt, c/o gradual onset and persistence of constant generalized body aches and fatigue for the past 4 months. Pt states he has been evaluated by his PMD multiple times for same and "referred to another doctor" who's name he cannot remember. Pt states he was dx with restless leg syndrome because his calves are "sore a lot." Pt states he does continue to walk for exercise 3 times/week, approximately 1 mile each walk. Pt states he came to the ED "to see if you had a pill I could take to feel better." Denies CP/palpitations, no SOB/cough, no abd pain, no N/V/D, no back pain, no rash, no fevers, no visual changes, no focal motor weakness, no tingling/numbness in extremities, no ataxia, no slurred speech, no facial droop.    Past Medical History:  Diagnosis Date  . Cancer [facility])    prostate   . Cough   . Depression   . Hypertension   . Macular degeneration    left eye  . Sleep apnea         Patient Active Problem List   Diagnosis Date Noted  . Obstructive sleep apnea 12/05/2010  . Rhinitis, chronic 12/05/2010  . COPD (chronic obstructive pulmonary disease) ([place]) 12/05/2010    Past Surgical History:  Procedure Laterality Date  . COLONOSCOPY    . COLONOSCOPY N/A 02/27/2013   Procedure: COLONOSCOPY;  Surgeon: Rogene Houston, MD;  Location: [facility] ENDO SUITE;  Service: Endoscopy;  Laterality: N/A;  730  . OLECRANON BURSECTOMY Right 06/30/2014   Procedure: EXCISION RIGHT OLECRANON BURSA;  Surgeon: Sanjuana Kava, MD;  Location: [facility] ORS;  Service: Orthopedics;  Laterality: Right;  . PROSTATECTOMY    . SEPTOPLASTY    . TOTAL KNEE ARTHROPLASTY Bilateral    Done in [facility]  . UVULOPALATOPHARYNGOPLASTY         Home Medications     Prior to Admission medications   Medication Sig Start Date End Date Taking? Authorizing Provider  chlorthalidone (HYGROTON) 25 MG tablet Take 25 mg by mouth daily.    Historical Provider, MD  Fexofenadine-Pseudoephedrine (ALLEGRA-D PO) Take 1 tablet by mouth every other day.    Historical Provider, MD  fluticasone (FLONASE) 50 MCG/ACT nasal spray Place 2 sprays into both nostrils daily. 01/21/15   Chesley Mires, MD  lisinopril (PRINIVIL,ZESTRIL) 20 MG tablet Take 20 mg by mouth daily.      Historical Provider, MD  zolpidem (AMBIEN) 10 MG tablet Take 1 tablet by mouth as needed. 09/30/14   Historical Provider, MD    Family History Family History  Problem Relation Age of Onset  . Colon cancer Father     Social History Social History  Substance Use Topics  . Smoking status: Current Every Day Smoker    Packs/day: 0.50    Years: 65.00    Types: Cigarettes  . Smokeless tobacco: Not on file  . Alcohol use 0.0 oz/week     Comment: bourbon daily 2-3 drinks daily     Allergies   Review of patient's allergies indicates no known allergies.   Review of Systems Review of Systems ROS: Statement: All systems negative except as marked or noted in the HPI; Constitutional: Negative for  fever and chills. +generalized body aches/fatigue.; ; Eyes: Negative for eye pain, redness and discharge. ; ; ENMT: Negative for ear pain, hoarseness, nasal congestion, sinus pressure and sore throat. ; ; Cardiovascular: Negative for chest pain, palpitations, diaphoresis, dyspnea and peripheral edema. ; ; Respiratory: Negative for cough, wheezing and stridor. ; ; Gastrointestinal: Negative for nausea, vomiting, diarrhea, abdominal pain, blood in stool, hematemesis, jaundice and rectal bleeding. . ; ; Genitourinary: Negative for dysuria, flank pain and hematuria. ; ; Musculoskeletal: Negative for back pain and neck pain. Negative for swelling and trauma.; ; Skin: Negative for pruritus, rash, abrasions, blisters, bruising  and skin lesion.; ; Neuro: Negative for headache, lightheadedness and neck stiffness. Negative for altered level of consciousness, altered mental status, extremity weakness, paresthesias, involuntary movement, seizure and syncope.     Physical Exam Updated Vital Signs BP 121/79 (BP Location: Right Arm)   Pulse 93   Temp 97.7 F (36.5 C) (Oral)   Resp 17   Ht 5\' 10"  (1.778 m)   Wt 200 lb (90.7 kg)   SpO2 94%   BMI 28.70 kg/m     15:50 Orthostatic Vital Signs FG  Orthostatic Lying   BP- Lying: 117/72  Pulse- Lying: 95      Orthostatic Sitting  BP- Sitting: 101/54  Pulse- Sitting: 104      Orthostatic Standing at 0 minutes  BP- Standing at 0 minutes: 103/73  Pulse- Standing at 0 minutes: 105     Physical Exam 1530: Physical examination:  Nursing notes reviewed; Vital signs and O2 SAT reviewed;  Constitutional: Well developed, Well nourished, Well hydrated, In no acute distress; Head:  Normocephalic, atraumatic; Eyes: EOMI, PERRL, No scleral icterus; ENMT: Mouth and pharynx normal, Mucous membranes moist; Neck: Supple, Full range of motion, No lymphadenopathy; Cardiovascular: Regular rate and rhythm, No gallop; Respiratory: Breath sounds clear & equal bilaterally, No wheezes.  Speaking full sentences with ease, Normal respiratory effort/excursion; Chest: Nontender, Movement normal; Abdomen: Soft, Nontender, Nondistended, Normal bowel sounds; Genitourinary: No CVA tenderness; Extremities: Pulses normal, No tenderness, No edema, No calf edema or asymmetry.; Neuro: AA&Ox3, Major CN grossly intact. No facial droop. Speech clear. Grips equal. Strength 5/5 equal bilat UE's and LE's. No gross focal motor or sensory deficits in extremities.; Skin: Color normal, Warm, Dry.   ED Treatments / Results  Labs (all labs ordered are listed, but only abnormal results are displayed)   EKG  EKG Interpretation  Date/Time:  Monday November 29 2015 15:48:57 EDT Ventricular Rate:  98 PR  Interval:    QRS Duration: 113 QT Interval:  356 QTC Calculation: 455 R Axis:   -57 Text Interpretation:  Sinus rhythm LAD, consider left anterior fascicular block Abnormal R-wave progression, early transition When compared with ECG of 06/29/2014 No significant change was found Confirmed by [facility]  MD, Nunzio Cory [phone]) on 11/29/2015 4:28:08 PM       Radiology   Procedures Procedures (including critical care time)  Medications Ordered in ED Medications - No data to display   Initial Impression / Assessment and Plan / ED Course  I have reviewed the triage vital signs and the nursing notes.  Pertinent labs & imaging results that were available during my care of the patient were reviewed by me and considered in my medical decision making (see chart for details).  MDM Reviewed: previous chart, nursing note and vitals Reviewed previous: labs and ECG Interpretation: labs, ECG and x-ray    Results for orders placed or performed during the hospital encounter  of 11/29/15  CBC with Differential  Result Value Ref Range   WBC 9.5 4.0 - 10.5 K/uL   RBC 5.21 4.22 - 5.81 MIL/uL   Hemoglobin 17.0 13.0 - 17.0 g/dL   HCT 48.4 39.0 - 52.0 %   MCV 92.9 78.0 - 100.0 fL   MCH 32.6 26.0 - 34.0 pg   MCHC 35.1 30.0 - 36.0 g/dL   RDW 13.4 11.5 - 15.5 %   Platelets 219 150 - 400 K/uL   Neutrophils Relative % 71 %   Neutro Abs 6.7 1.7 - 7.7 K/uL   Lymphocytes Relative 21 %   Lymphs Abs 1.9 0.7 - 4.0 K/uL   Monocytes Relative 8 %   Monocytes Absolute 0.8 0.1 - 1.0 K/uL   Eosinophils Relative 0 %   Eosinophils Absolute 0.0 0.0 - 0.7 K/uL   Basophils Relative 0 %   Basophils Absolute 0.0 0.0 - 0.1 K/uL   Dg Chest 2 View Result Date: 11/29/2015 CLINICAL DATA:  Pt report feeling full body aches for the last 4 weeks. Pt says that his PCP diagnosed him with restless leg syndrome. Pt has not been working out recently due to the weather being too hot but usually walk 3 times a week up to a mile at a  time. EXAM: CHEST  2 VIEW COMPARISON:  10/22/2010 FINDINGS: Heart size is normal. There are no focal consolidations. Minimal bibasilar atelectasis. No pleural effusions or pulmonary edema. Mild mid thoracic spondylosis noted. IMPRESSION: No evidence for acute cardiopulmonary abnormality. Electronically Signed   By: Nolon Nations M.D.   On: 11/29/2015 16:29    1650:   Pt states he "doesn't want to wait anymore" and "my doctor did a bunch of labs and they were ok." Pt refuses to stay. ED RN and I encouraged pt to stay, continues to refuse.  Pt makes his own medical decisions.  Risks of AMA explained to pt, including, but not limited to:  stroke, heart attack, cardiac arrythmia ("irregular heart rate/beat"), "passing out," temporary and/or permanent disability, death.  Pt verb understanding and continues to refuse to stay for testing results, understanding the consequences of his decision.  I encouraged pt to follow up with his PMD tomorrow and return to the ED immediately if symptoms worsen, or for any other concerns.  Pt verb understanding, agreeable.     Final Clinical Impressions(s) / ED Diagnoses   Final diagnoses:  None    New Prescriptions New Prescriptions   No medications on file     Francine Graven, DO 12/01/15 1804

## 2015-11-29 NOTE — ED Triage Notes (Signed)
Pt reports generalized body aches for >4 months.  States Dr. Willey Blade is working on this and referred him to another Dr. On Wed, but he never heard anything.  States "I figured I'd come check myself in to see what ya'll could do."

## 2015-11-29 NOTE — ED Notes (Signed)
Pt wants to home and refuses to stay in the hospital anymore.

## 2015-11-29 NOTE — ED Notes (Signed)
Pt report feeling full body aches for the last 4 weeks.  Pt says that his PCP diagnosed him with restless leg syndrome. Pt has not been working out recently due to the weather being too hot but usually walk 3 times a week up to a mile at a time.

## 2015-11-30 LAB — URINE CULTURE

## 2015-12-02 DIAGNOSIS — R202 Paresthesia of skin: Secondary | ICD-10-CM | POA: Diagnosis not present

## 2015-12-08 DIAGNOSIS — M25561 Pain in right knee: Secondary | ICD-10-CM | POA: Diagnosis not present

## 2015-12-08 DIAGNOSIS — G629 Polyneuropathy, unspecified: Secondary | ICD-10-CM | POA: Diagnosis not present

## 2015-12-08 DIAGNOSIS — M25569 Pain in unspecified knee: Secondary | ICD-10-CM | POA: Diagnosis not present

## 2015-12-08 DIAGNOSIS — R531 Weakness: Secondary | ICD-10-CM | POA: Diagnosis not present

## 2015-12-08 DIAGNOSIS — Z471 Aftercare following joint replacement surgery: Secondary | ICD-10-CM | POA: Diagnosis not present

## 2015-12-08 DIAGNOSIS — M25562 Pain in left knee: Secondary | ICD-10-CM | POA: Diagnosis not present

## 2015-12-08 DIAGNOSIS — Z96653 Presence of artificial knee joint, bilateral: Secondary | ICD-10-CM | POA: Diagnosis not present

## 2015-12-09 DIAGNOSIS — M6281 Muscle weakness (generalized): Secondary | ICD-10-CM | POA: Diagnosis not present

## 2015-12-09 DIAGNOSIS — M4806 Spinal stenosis, lumbar region: Secondary | ICD-10-CM | POA: Diagnosis not present

## 2015-12-09 DIAGNOSIS — R2689 Other abnormalities of gait and mobility: Secondary | ICD-10-CM | POA: Diagnosis not present

## 2015-12-10 DIAGNOSIS — R2689 Other abnormalities of gait and mobility: Secondary | ICD-10-CM | POA: Diagnosis not present

## 2015-12-10 DIAGNOSIS — M4806 Spinal stenosis, lumbar region: Secondary | ICD-10-CM | POA: Diagnosis not present

## 2015-12-10 DIAGNOSIS — M6281 Muscle weakness (generalized): Secondary | ICD-10-CM | POA: Diagnosis not present

## 2015-12-12 DIAGNOSIS — M47816 Spondylosis without myelopathy or radiculopathy, lumbar region: Secondary | ICD-10-CM | POA: Diagnosis not present

## 2015-12-12 DIAGNOSIS — M2578 Osteophyte, vertebrae: Secondary | ICD-10-CM | POA: Diagnosis not present

## 2015-12-12 DIAGNOSIS — M5126 Other intervertebral disc displacement, lumbar region: Secondary | ICD-10-CM | POA: Diagnosis not present

## 2015-12-12 DIAGNOSIS — M4806 Spinal stenosis, lumbar region: Secondary | ICD-10-CM | POA: Diagnosis not present

## 2015-12-13 DIAGNOSIS — M6281 Muscle weakness (generalized): Secondary | ICD-10-CM | POA: Diagnosis not present

## 2015-12-13 DIAGNOSIS — M4806 Spinal stenosis, lumbar region: Secondary | ICD-10-CM | POA: Diagnosis not present

## 2015-12-13 DIAGNOSIS — R2689 Other abnormalities of gait and mobility: Secondary | ICD-10-CM | POA: Diagnosis not present

## 2015-12-15 ENCOUNTER — Telehealth (HOSPITAL_COMMUNITY): Payer: Self-pay

## 2015-12-15 DIAGNOSIS — G6289 Other specified polyneuropathies: Secondary | ICD-10-CM | POA: Diagnosis not present

## 2015-12-15 DIAGNOSIS — G629 Polyneuropathy, unspecified: Secondary | ICD-10-CM | POA: Diagnosis not present

## 2015-12-15 NOTE — Telephone Encounter (Signed)
12/15/15 Annie Main called and asked if we could start seeing his dad for therapy.  He is currently receiving therapy from [facility] in [place].  I spoke with Caryl Pina and his next appt is 9/21 and Caryl Pina will get him to sign a release of information and she has our fax # so we can receive his records.  I figured once I heard back from her we could schedule him.

## 2015-12-16 ENCOUNTER — Ambulatory Visit: Admit: 2015-12-16 | Payer: MEDICARE | Attending: Internal Medicine | Primary: Internal Medicine

## 2015-12-16 DIAGNOSIS — G619 Inflammatory polyneuropathy, unspecified: Secondary | ICD-10-CM | POA: Diagnosis not present

## 2015-12-16 DIAGNOSIS — I70209 Unspecified atherosclerosis of native arteries of extremities, unspecified extremity: Secondary | ICD-10-CM | POA: Diagnosis not present

## 2015-12-16 DIAGNOSIS — M6281 Muscle weakness (generalized): Secondary | ICD-10-CM | POA: Diagnosis not present

## 2015-12-16 DIAGNOSIS — R2689 Other abnormalities of gait and mobility: Secondary | ICD-10-CM | POA: Diagnosis not present

## 2015-12-16 DIAGNOSIS — I10 Essential (primary) hypertension: Secondary | ICD-10-CM | POA: Diagnosis not present

## 2015-12-16 DIAGNOSIS — I7090 Unspecified atherosclerosis: Secondary | ICD-10-CM | POA: Diagnosis not present

## 2015-12-16 DIAGNOSIS — Z79899 Other long term (current) drug therapy: Secondary | ICD-10-CM | POA: Diagnosis not present

## 2015-12-16 DIAGNOSIS — R7302 Impaired glucose tolerance (oral): Secondary | ICD-10-CM | POA: Diagnosis not present

## 2015-12-16 DIAGNOSIS — G629 Polyneuropathy, unspecified: Secondary | ICD-10-CM | POA: Diagnosis not present

## 2015-12-16 DIAGNOSIS — M4806 Spinal stenosis, lumbar region: Secondary | ICD-10-CM | POA: Diagnosis not present

## 2015-12-16 LAB — AMB POC HEMOGLOBIN A1C: Hemoglobin A1c (POC): 6.2 % — AB (ref 4.8–5.6)

## 2015-12-16 LAB — AMB POC COMPLETE CBC,AUTOMATED ENTER
ABS. GRANS (POC): 5.9 10*3/uL (ref 1.4–6.5)
ABS. LYMPHS (POC): 2.3 10*3/uL (ref 1.2–3.4)
ABS. MONOS (POC): 0.3 10*3/uL (ref 0.1–0.6)
GRANULOCYTES (POC): 69.1 % (ref 42.2–75.2)
HCT (POC): 48.2 % (ref 35.0–60.0)
HGB (POC): 15.7 g/dL (ref 11–18)
LYMPHOCYTES (POC): 27.3 % (ref 20.5–51.1)
MCH (POC): 30.4 pg (ref 27.0–31.0)
MCHC (POC): 32.6 g/dL — AB (ref 33.0–37.0)
MCV (POC): 93.3 fL (ref 80.0–99.9)
MONOCYTES (POC): 3.6 % (ref 1.7–9.3)
MPV (POC): 6 fL — AB (ref 7.8–11)
PLATELET (POC): 274 10*3/uL (ref 150–450)
RBC (POC): 5.17 M/uL (ref 4.00–6.00)
RDW (POC): 14.2 % — AB (ref 11.6–13.7)
WBC (POC): 8.5 10*3/uL (ref 4.5–10.5)

## 2015-12-16 NOTE — Patient Instructions (Addendum)
You are pre diabetic . Avoid sweets, Carbohydrates(like bread, pasta, rice, and potatoes). Avoid alcohol     Stop smoking     For now stay off Lisinopril and Chlorthalidone blood pressure medicines.     See Doctor  Ouida Sills in October

## 2015-12-16 NOTE — Progress Notes (Signed)
David Porter is a 82 y.o. 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 Jonny RuizJohn Ward . sbp 96 .  Stopped  Ambien and his 2 bp meds  Then he saw  [facility] Dr[facility] :  knees XRays were intact but had vascular calcification. Pt was then started on ?Gabapentin like medication qhs per family and previous leg tingling resolved. Pt uses ETOH usually 2 drinks daily , but recently 1 drink per day . [facility]iat[facility] Dr. Franchot Erichsenosenblatt found apparently no evidence of Depression or Demetnia. [facility] Neurologist Dr. Justin MendVota 's EMG revealed sensory Neuropathy affecting soles and requested B12, Screen for DM, and SIEP.     Pt sleeping well recently and denies claudication and reports only minor throat congestion -hx Allergic Rhinitis     Past Medical History:   Diagnosis Date   ??? Atherosclerosis of artery of extremity without gangrene (HCC)     atherosclerosis of leg arteries seen incidentally on Xrays of knees in Sept 2017   ??? Cancer [facility](HCC)     Prostate    ??? Colon polyp    ??? Glucose intolerance (impaired glucose tolerance) 12/16/2015    Hemglobin A1C 6.2 %   ??? Hypertension    ??? Obstructive sleep apnea    ??? Seasonal allergic rhinitis    ??? Sensory neuropathy (HCC) 11/2015    EMG : Dr. Elayne GuerinScott Vota, [facility]     Past Surgical History:   Procedure Laterality Date   ??? HX COLONOSCOPY  2013    negative    ??? HX COLONOSCOPY      Polypectomy prior to 2013   ??? HX ORTHOPAEDIC  2002    b/l TKR   ??? HX PROSTATECTOMY  2004    for ca   ??? HX UROLOGICAL         Allergies   Allergen Reactions   ??? Ambien [Zolpidem] Other (comments)     Possible confusion      Family History   Problem Relation Age of Onset   ??? Cancer Father      colon      History   Smoking Status   ??? Current Every Day Smoker   Smokeless Tobacco   ??? Not on file        Review of Systems  He is intolerant of CPAP due to nasal congestion. Breathe rite strips are helpful . He wears hearing aids which he does not have today but hears satisfactorily     Physical Examination:  Visit Vitals   ??? BP 148/72     Physical Exam  General:   Appears in no acute distress.   HEENT:   throat clear                    Lungs:    mild I and E wheezes and scattered rhonchi       Heart:  Regular without murmur, gallop or rub       Abdomen:   Benign exam without organomegaly or mass palpable                Rectal:     Extremities:  Knees: f/e intact s/p b/l TKR. No edema 2+ DP  pulses.    Neurologic: Grossly nonfocal . Light touch sensation intact in LE's       Results for orders placed or performed in visit on 12/16/15   AMB POC HEMOGLOBIN A1C   Result Value Ref Range    Hemoglobin A1c (POC) 6.2 (A) 4.8 - 5.6 %   AMB POC COMPLETE CBC,AUTOMATED ENTER   Result Value Ref Range    WBC (POC) 8.5 4.5 - 10.5 K/uL    LYMPHOCYTES (POC) 27.3 20.5 - 51.1 %    MONOCYTES (POC) 3.6 1.7 - 9.3 %    GRANULOCYTES (POC) 69.1 42.2 - 75.2 %    ABS. LYMPHS (POC) 2.3 1.2 - 3.4 K/uL    ABS. MONOS (POC) 0.3 0.1 - 0.6 10^3/ul    ABS. GRANS (POC) 5.9 1.4 - 6.5 10^3/ul    RBC (POC) 5.17 4.00 - 6.00 M/uL    HGB (POC) 15.7 11 - 18 g/dL    HCT (POC) 08.6 57.8 - 60.0 %    MCV (POC) 93.3 80.0 - 99.9 fL    MCH (POC) 30.4 27.0 - 31.0 pg    MCHC (POC) 32.6 (A) 33.0 - 37.0 g/dL    RDW (POC) 46.9 (A) 62.9 - 13.7 %    PLATELET (POC) 274 150 - 450 K/uL    MPV (POC) 6.0 (A) 7.8 - 11 fL     Assessment/Plan    ICD-10-CM ICD-9-CM    1. Essential hypertension I10 401.9 AMB POC HEMOGLOBIN A1C      METABOLIC PANEL, COMPREHENSIVE      VITAMIN B12      PR COLLECTION VENOUS BLOOD,VENIPUNCTURE      AMB POC COMPLETE CBC,AUTOMATED ENTER      IMMUNOELECTROPHORESIS (IMMUNOFIX.)   2. Encounter for long-term (current) use of other medications Z79.899 V58.69 AMB POC HEMOGLOBIN A1C      METABOLIC PANEL, COMPREHENSIVE      VITAMIN B12       PR COLLECTION VENOUS BLOOD,VENIPUNCTURE      AMB POC COMPLETE CBC,AUTOMATED ENTER      IMMUNOELECTROPHORESIS (IMMUNOFIX.)      OTHER   3. Glucose intolerance (impaired glucose tolerance) R73.02 790.22 AMB POC HEMOGLOBIN A1C   4. Sensory neuropathy (HCC) G62.9 356.9 AMB POC HEMOGLOBIN A1C      METABOLIC PANEL, COMPREHENSIVE      VITAMIN B12      PR COLLECTION VENOUS BLOOD,VENIPUNCTURE      AMB POC COMPLETE CBC,AUTOMATED ENTER      IMMUNOELECTROPHORESIS (IMMUNOFIX.)      OTHER   5. Atherosclerosis of artery of extremity without gangrene (HCC) I70.209 440.20 AMB POC HEMOGLOBIN A1C      METABOLIC PANEL, COMPREHENSIVE      VITAMIN B12      PR COLLECTION VENOUS BLOOD,VENIPUNCTURE      AMB POC COMPLETE CBC,AUTOMATED ENTER      IMMUNOELECTROPHORESIS (IMMUNOFIX.)      OTHER     HTN controlled off medication.   Glucose intolerance. Advised ADA diet and restricting ETOH  Sensory Neuropathy. Discussed fall risk with it and importance of controlling BS and avoiding ETOH. Check SIEP and B12 levels also.   Atherosclerosis LE's . Check CMP. He reports favorable lipid readings in the past.  Still, he may benefit from statin rx to prevent progression of ASCVD  I counseled him to stop smoking . We discussed that wheezing  On exam is concerning for smoking related lung damage. I advised f/u in 10/17 with Dr Carylon Perches, his PCP in  [place].  Pt knows to stay off Ambien       Author:  Ysidro Evert, MD [phone] PM9/21/2017

## 2015-12-20 LAB — METABOLIC PANEL, COMPREHENSIVE
A-G Ratio: 1.9 (ref 1.2–2.2)
ALT (SGPT): 18 IU/L (ref 0–44)
AST (SGOT): 20 IU/L (ref 0–40)
Albumin: 4.1 g/dL (ref 3.5–4.7)
Alk. phosphatase: 49 IU/L (ref 39–117)
BUN/Creatinine ratio: 21 (ref 10–24)
BUN: 15 mg/dL (ref 8–27)
Bilirubin, total: 0.5 mg/dL (ref 0.0–1.2)
CO2: 29 mmol/L (ref 18–29)
Calcium: 9.6 mg/dL (ref 8.6–10.2)
Chloride: 102 mmol/L (ref 96–106)
Creatinine: 0.73 mg/dL — ABNORMAL LOW (ref 0.76–1.27)
GFR est AA: 98 mL/min/{1.73_m2} (ref >59)
GFR est non-AA: 85 mL/min/{1.73_m2} (ref >59)
GLOBULIN, TOTAL: 2.2 g/dL (ref 1.5–4.5)
Glucose: 92 mg/dL (ref 65–99)
Potassium: 4.3 mmol/L (ref 3.5–5.2)
Protein, total: 6.3 g/dL (ref 6.0–8.5)
Sodium: 142 mmol/L (ref 134–144)

## 2015-12-20 LAB — IMMUNOELECTROPHORESIS (IMMUNOFIX.)
Immunoglobulin A, Qt.: 29 mg/dL — ABNORMAL LOW (ref 61–437)
Immunoglobulin G, Qt.: 420 mg/dL — ABNORMAL LOW (ref 700–1600)
Immunoglobulin M, Qt.: 906 mg/dL — ABNORMAL HIGH (ref 15–143)

## 2015-12-20 LAB — VITAMIN B12: Vitamin B12: 322 pg/mL (ref 211–946)

## 2015-12-21 DIAGNOSIS — H35352 Cystoid macular degeneration, left eye: Secondary | ICD-10-CM | POA: Diagnosis not present

## 2015-12-21 DIAGNOSIS — H353133 Nonexudative age-related macular degeneration, bilateral, advanced atrophic without subfoveal involvement: Secondary | ICD-10-CM | POA: Diagnosis not present

## 2015-12-21 DIAGNOSIS — H35722 Serous detachment of retinal pigment epithelium, left eye: Secondary | ICD-10-CM | POA: Diagnosis not present

## 2015-12-21 DIAGNOSIS — H353221 Exudative age-related macular degeneration, left eye, with active choroidal neovascularization: Secondary | ICD-10-CM | POA: Diagnosis not present

## 2015-12-22 NOTE — Telephone Encounter (Signed)
Phone # not in service. I also called pt.

## 2015-12-22 NOTE — Telephone Encounter (Signed)
Wanted to follow up yesterdays call.  Patient# [phone]

## 2015-12-27 ENCOUNTER — Other Ambulatory Visit (HOSPITAL_COMMUNITY): Payer: Self-pay | Admitting: Internal Medicine

## 2015-12-27 DIAGNOSIS — Z8546 Personal history of malignant neoplasm of prostate: Secondary | ICD-10-CM

## 2015-12-27 DIAGNOSIS — R599 Enlarged lymph nodes, unspecified: Secondary | ICD-10-CM

## 2015-12-28 ENCOUNTER — Encounter

## 2015-12-28 NOTE — Progress Notes (Signed)
Referred pt to Dr. Quita SkyeMitchell's office . I spoke Golden Gilreath/ dtr yesterday  By phone. [place]

## 2015-12-29 ENCOUNTER — Ambulatory Visit (HOSPITAL_COMMUNITY): Payer: Medicare Other

## 2015-12-29 ENCOUNTER — Encounter (HOSPITAL_COMMUNITY): Payer: Self-pay

## 2015-12-30 ENCOUNTER — Ambulatory Visit (HOSPITAL_COMMUNITY): Payer: Medicare Other | Attending: Orthopedic Surgery | Admitting: Physical Therapy

## 2015-12-30 DIAGNOSIS — R2681 Unsteadiness on feet: Secondary | ICD-10-CM | POA: Diagnosis not present

## 2015-12-30 DIAGNOSIS — M6281 Muscle weakness (generalized): Secondary | ICD-10-CM | POA: Diagnosis not present

## 2015-12-30 DIAGNOSIS — Z9181 History of falling: Secondary | ICD-10-CM | POA: Diagnosis not present

## 2015-12-30 NOTE — Therapy (Signed)
[facility] [address] [place], [place], [address] Phone: [phone]   Fax:  [phone]  Physical Therapy Evaluation  Patient Details  Name: Alexander Young MRN: [id number] Date of Birth: [birth year] Referring Provider: Abundio Miu   Encounter Date: 12/30/2015      PT End of Session - 12/30/15 1723    Visit Number 1   Number of Visits 8   Date for PT Re-Evaluation 01/27/16   Authorization Type Medicare and BCBS Supplement    Authorization Time Period 12/30/15 to 01/30/16   Authorization - Visit Number 1   Authorization - Number of Visits 10   PT Start Time 1630   PT Stop Time 1713   PT Time Calculation (min) 43 min   Equipment Utilized During Treatment Gait belt   Activity Tolerance Patient tolerated treatment well   Behavior During Therapy [facility] for tasks assessed/performed      Past Medical History:  Diagnosis Date  . Cancer [facility])    prostate   . Cough   . Depression   . Hypertension   . Macular degeneration    left eye  . Sleep apnea         Past Surgical History:  Procedure Laterality Date  . COLONOSCOPY    . COLONOSCOPY N/A 02/27/2013   Procedure: COLONOSCOPY;  Surgeon: Rogene Houston, MD;  Location: [facility] ENDO SUITE;  Service: Endoscopy;  Laterality: N/A;  730  . OLECRANON BURSECTOMY Right 06/30/2014   Procedure: EXCISION RIGHT OLECRANON BURSA;  Surgeon: Sanjuana Kava, MD;  Location: [facility] ORS;  Service: Orthopedics;  Laterality: Right;  . PROSTATECTOMY    . SEPTOPLASTY    . TOTAL KNEE ARTHROPLASTY Bilateral    Done in [place]  . UVULOPALATOPHARYNGOPLASTY      There were no vitals filed for this visit.       Subjective Assessment - 12/30/15 1633    Subjective Patient has been getting PT in [place]; he only went 3 times and wanted to transfer here due to location. The patient also describes some signs of neuropathy, which was included in refrerral. Patient states that he is having trouble getting up unless  he is at a certain angle, he has to be at the right angle otherwise he has to push/pull himself up. His balance has been a little wobbly, he sometimes finds himself slightly off balance. His PSA has been 0 for quite some time. He feels like the calves in his legs ache, and states that the medicine that his MD gave him is helping his pain/neuropathy symptoms.    Pertinent History OSA, COPD, history of B knee replacements, poor medication compliance    How long can you stand comfortably? 30-45 minutes then gets tired    How long can you walk comfortably? would have to build back up to it, but he had been walking  1-1.5 miles    Patient Stated Goals get stronger to get up from the floor easier    Currently in Pain? No/denies            [facility] PT Assessment - 12/30/15 0001      Assessment   Medical Diagnosis muscle weakness, peripheral neuropathy    Referring Provider Abundio Miu    Onset Date/Surgical Date --  chronic    Next MD Visit no follow ups that patient knows about right now      Precautions   Precautions Fall     Balance Screen   Has  the patient fallen in the past 6 months Yes   How many times? 2   Has the patient had a decrease in activity level because of a fear of falling?  No   Is the patient reluctant to leave their home because of a fear of falling?  No     Prior Function   Level of Independence Independent;Independent with basic ADLs;Independent with transfers;Independent with gait   Vocation Retired     Haematologist Postural limitations   Postural Limitations Forward head;Flexed trunk  history of spinal stenosis      Strength   Right Hip Flexion 4/5   Right Hip Extension 3/5   Right Hip ABduction 4/5  hip flexor compensation    Left Hip Flexion 4/5   Left Hip Extension 3/5   Left Hip ABduction 4/5  hip flexor compensation    Right Knee Flexion 4+/5   Right Knee Extension 5/5   Left Knee Flexion 4+/5   Left Knee  Extension 5/5   Right Ankle Dorsiflexion 4/5   Left Ankle Dorsiflexion 4+/5     Ambulation/Gait   Gait Comments flexed at hips, reduced ankle DF bilaterally, reduced gait speed      6 minute walk test results    Aerobic Endurance Distance Walked 564   Endurance additional comments 3MWT      Berg Balance Test   Sit to Stand Able to stand without using hands and stabilize independently   Standing Unsupported Able to stand safely 2 minutes   Sitting with Back Unsupported but Feet Supported on Floor or Stool Able to sit safely and securely 2 minutes   Stand to Sit Sits safely with minimal use of hands   Transfers Able to transfer safely, minor use of hands   Standing Unsupported with Eyes Closed Able to stand 10 seconds with supervision   Standing Ubsupported with Feet Together Able to place feet together independently but unable to hold for 30 seconds   From Standing, Reach Forward with Outstretched Arm Can reach confidently >25 cm (10")   From Standing Position, Pick up Object from Floor Able to pick up shoe safely and easily   From Standing Position, Turn to Look Behind Over each Shoulder Looks behind one side only/other side shows less weight shift   Turn 360 Degrees Able to turn 360 degrees safely but slowly   Standing Unsupported, Alternately Place Feet on Step/Stool Needs assistance to keep from falling or unable to try   Standing Unsupported, One Foot in Front Needs help to step but can hold 15 seconds   Standing on One Leg Able to lift leg independently and hold equal to or more than 3 seconds   Total Score 41                           PT Education - 12/30/15 1722    Education provided Yes   Education Details prognosis, HEP updates, POC, importance of compliance with medications as prescribed by MD    Person(s) Educated Patient   Methods Explanation;Demonstration;Handout   Comprehension Verbalized understanding;Returned demonstration;Need further instruction           PT Short Term Goals - 12/30/15 1731      PT SHORT TERM GOAL #1   Title Patient to be able to verbally state 5/5 safety precautions/fall prevention strategies in order to improve general safety and reduce fall risk    Time 2  Period Weeks   Status New     PT SHORT TERM GOAL #2   Title Patient to be able to perform 10 sit to stands with no UEs and SOB/DOE no more than 3/10 in order to show improved functional strength and mobility    Time 2   Period Weeks   Status New     PT SHORT TERM GOAL #3   Title Patient to score at least 46 on BERG balance test in order to show improved balance skills and reduced fall risk    Time 2   Period Weeks   Status New     PT SHORT TERM GOAL #4   Title Patient to be consistently and correctly performing appropriate HEP, to be updated PRN    Time 1   Period Weeks   Status New           PT Long Term Goals - 12/30/15 1734      PT LONG TERM GOAL #1   Title Patient to demonstrate functional strength 5/5 in order to improve general mobility and assist in advanced functional task performance with reduced fall risk    Time 4   Period Weeks   Status New     PT LONG TERM GOAL #2   Title Patient to score at least 51 on BERG balance test in order to show improved balance skills and reduced fall risk    Time 4   Period Weeks   Status New     PT LONG TERM GOAL #3   Title Patient to be able to perform a full floor to stand transfer with mod(I) and no cues from therapy staff, exertion/difficulty rating no more than 2/10, in order to improve general physical performance and mobility at home    Time 4   Period Weeks   Status New     PT LONG TERM GOAL #4   Title Patient to be participatory in appropriate regular aerobic exercise program, at least 20 minutes in duration, at least 4 days per week, in order to maintain functional gains and to improve general health status    Time 4   Period Weeks   Status New               Plan  - 12/30/15 1723    Clinical Impression Statement Patient arrives today with reports of generalized weakness in both of his LEs as well as a couple of falls that occurred earlier this year; patient had been getting PT in [place] but family had requested transfer to this clinic for further care. Upon examination, patient reveals postural deficits, functional muscle weakness, gait impairment, significant unsteadiness, reduced functional activity tolerance, and also appears to demonstrate reduced safety awareness in general. Modified patient's HEP to 2 exercises as he reports that he has been having trouble keeping up with the 8 exercise plan he was given from the other clinic. Patient is non-compliant with HTN medication, and recommend regular screening of BP as he attends therapy here. Recommend skilled PT services to address functional limitations and assist in reaching optimal level of function with minimal fall risk.    Rehab Potential Good   Clinical Impairments Affecting Rehab Potential COPD, poor compliance with HTN medicine, likely chronic presentation of impairments    PT Frequency 2x / week   PT Duration 4 weeks   PT Treatment/Interventions ADLs/Self Care Home Management;Biofeedback;Cryotherapy;Moist Heat;DME Instruction;Gait training;Stair training;Functional mobility training;Therapeutic activities;Therapeutic exercise;Balance training;Neuromuscular re-education;Patient/family education;Manual techniques;Energy conservation;Taping   PT Next  Visit Plan review modified HEP and goals; check BP/HR/O2 regularly. Functional strength, gait, and balance focus. Safety awareness training. CAUTION WITH ADDING TO HEP DUE TO COMPLIANCE CONCERNS.    PT Home Exercise Plan 10/5: modified to bridges with ABD into red TB, tandem stance with fingertip touch at counter    Consulted and Agree with Plan of Care Patient      Patient will benefit from skilled therapeutic intervention in order to improve the following  deficits and impairments:  Abnormal gait, Improper body mechanics, Cardiopulmonary status limiting activity, Decreased mobility, Postural dysfunction, Decreased activity tolerance, Decreased strength, Decreased balance, Decreased safety awareness, Difficulty walking, Impaired flexibility  Visit Diagnosis: Muscle weakness (generalized) - Plan: PT plan of care cert/re-cert  Unsteadiness on feet - Plan: PT plan of care cert/re-cert  History of falling - Plan: PT plan of care cert/re-cert      G-Codes - [id number]    Functional Assessment Tool Used Based on skilled clinical assessment of strength, balance, gait, general fall risk    Functional Limitation Mobility: Walking and moving around   Mobility: Walking and Moving Around Current Status [id number]) At least 40 percent but less than 60 percent impaired, limited or restricted   Mobility: Walking and Moving Around Goal Status [phone]) At least 20 percent but less than 40 percent impaired, limited or restricted       Problem List Patient Active Problem List   Diagnosis Date Noted  . Obstructive sleep apnea 12/05/2010  . Rhinitis, chronic 12/05/2010  . COPD (chronic obstructive pulmonary disease) ([place]) 12/05/2010    Deniece Ree PT, DPT [facility] [address] [place], [place], [address] Phone: [phone]   Fax:  [phone]  Name: Alexander Young MRN: [id number] Date of Birth: January 29, 1930

## 2015-12-30 NOTE — Patient Instructions (Signed)
   BRIDGING ELASTIC BAND ABDUCTION  While lying on your back, place an elastic band around your knees and pull your knees apart. Hold this and then tighten your lower abdominals, squeeze your buttocks and raise your buttocks off the floor/bed as creating a "Bridge" with your body.  Make sure you are BREATHING OUT when you lift your hips up.  Repeat 10 times, twice a day.     TANDEM STANCE WITH SUPPORT  Stand in front of a chair, table or counter top for support. Then place the heel of one foot so that it is touching the toes of the other foot. Maintain your balance in this position.   Hold for as long as you can, then switch your feet. Repeat 2-3 times each side, twice a day.

## 2015-12-31 NOTE — Telephone Encounter (Signed)
I called and left the message with them.

## 2015-12-31 NOTE — Telephone Encounter (Signed)
Called to get patient ct scan results faxed for his referral. I dint not see a ct scan in his chart.  Phone# [phone]  Fax#[phone]

## 2016-01-03 DIAGNOSIS — E119 Type 2 diabetes mellitus without complications: Secondary | ICD-10-CM | POA: Diagnosis not present

## 2016-01-03 NOTE — Telephone Encounter (Signed)
Called to speak with nurse about her oncology appointment.  Patient# [phone]

## 2016-01-03 NOTE — Telephone Encounter (Signed)
Selena BattenKim will call [facility] to see if he has an app't

## 2016-01-04 ENCOUNTER — Ambulatory Visit (HOSPITAL_COMMUNITY): Payer: Medicare Other | Admitting: Physical Therapy

## 2016-01-05 ENCOUNTER — Ambulatory Visit (HOSPITAL_COMMUNITY): Payer: Medicare Other | Admitting: Physical Therapy

## 2016-01-05 ENCOUNTER — Telehealth (HOSPITAL_COMMUNITY): Payer: Self-pay | Admitting: Physical Therapy

## 2016-01-05 DIAGNOSIS — D472 Monoclonal gammopathy: Secondary | ICD-10-CM | POA: Diagnosis not present

## 2016-01-05 DIAGNOSIS — R590 Localized enlarged lymph nodes: Secondary | ICD-10-CM | POA: Diagnosis not present

## 2016-01-05 NOTE — Telephone Encounter (Signed)
Patient returned call stating he called Monday to cancel his appointments this week due to being in [place].  States he will resume therapy next week. Teena Irani, PTA/CLT [phone]

## 2016-01-05 NOTE — Telephone Encounter (Signed)
Patient did not show for appointment.  Called and left message regarding missed appointment and reminder of next appointment tomorrow morning at [place], PTA/CLT [phone]

## 2016-01-06 ENCOUNTER — Ambulatory Visit (HOSPITAL_COMMUNITY): Payer: Medicare Other | Admitting: Physical Therapy

## 2016-01-06 DIAGNOSIS — J479 Bronchiectasis, uncomplicated: Secondary | ICD-10-CM | POA: Diagnosis not present

## 2016-01-06 DIAGNOSIS — N281 Cyst of kidney, acquired: Secondary | ICD-10-CM | POA: Diagnosis not present

## 2016-01-06 DIAGNOSIS — K5730 Diverticulosis of large intestine without perforation or abscess without bleeding: Secondary | ICD-10-CM | POA: Diagnosis not present

## 2016-01-06 DIAGNOSIS — R590 Localized enlarged lymph nodes: Secondary | ICD-10-CM | POA: Diagnosis not present

## 2016-01-06 DIAGNOSIS — K119 Disease of salivary gland, unspecified: Secondary | ICD-10-CM | POA: Diagnosis not present

## 2016-01-06 DIAGNOSIS — R918 Other nonspecific abnormal finding of lung field: Secondary | ICD-10-CM | POA: Diagnosis not present

## 2016-01-06 DIAGNOSIS — Z8709 Personal history of other diseases of the respiratory system: Secondary | ICD-10-CM | POA: Diagnosis not present

## 2016-01-10 ENCOUNTER — Ambulatory Visit (HOSPITAL_COMMUNITY): Payer: Medicare Other | Admitting: Physical Therapy

## 2016-01-10 ENCOUNTER — Ambulatory Visit (HOSPITAL_COMMUNITY): Payer: Medicare Other

## 2016-01-10 DIAGNOSIS — M6281 Muscle weakness (generalized): Secondary | ICD-10-CM | POA: Diagnosis not present

## 2016-01-10 DIAGNOSIS — Z9181 History of falling: Secondary | ICD-10-CM

## 2016-01-10 DIAGNOSIS — R2681 Unsteadiness on feet: Secondary | ICD-10-CM

## 2016-01-10 NOTE — Therapy (Signed)
[place] [address] [place], [place], [address] Phone: [phone]   Fax:  [phone]  Physical Therapy Treatment  Patient Details  Name: Alexander Young MRN: [id number] Date of Birth: 11/30/1929 Referring Provider: Abundio Miu   Encounter Date: 01/10/2016      PT End of Session - 01/10/16 0900    Visit Number 2   Number of Visits 8   Date for PT Re-Evaluation 01/27/16   Authorization Type Medicare and BCBS Supplement    Authorization Time Period 12/30/15 to 01/30/16   Authorization - Visit Number 2   Authorization - Number of Visits 10   PT Start Time 0817   PT Stop Time 0855   PT Time Calculation (min) 38 min   Equipment Utilized During Treatment Gait belt   Activity Tolerance Patient tolerated treatment well   Behavior During Therapy [facility] for tasks assessed/performed      Past Medical History:  Diagnosis Date  . Cancer [facility])    prostate   . Cough   . Depression   . Hypertension   . Macular degeneration    left eye  . Sleep apnea         Past Surgical History:  Procedure Laterality Date  . COLONOSCOPY    . COLONOSCOPY N/A 02/27/2013   Procedure: COLONOSCOPY;  Surgeon: Rogene Houston, MD;  Location: [facility] ENDO [facility];  Service: Endoscopy;  Laterality: N/A;  730  . OLECRANON BURSECTOMY Right 06/30/2014   Procedure: EXCISION RIGHT OLECRANON BURSA;  Surgeon: Sanjuana Kava, MD;  Location: [facility] ORS;  Service: Orthopedics;  Laterality: Right;  . PROSTATECTOMY    . SEPTOPLASTY    . TOTAL KNEE ARTHROPLASTY Bilateral    Done in [place] [facility]  . UVULOPALATOPHARYNGOPLASTY      There were no vitals filed for this visit.      Subjective Assessment - 01/10/16 0840    Subjective PT IS VERY HARD OF HEARING.  Pt states he has no pain. States he was in [place] all last week going to different MD's and getting tests.  Still has no results yet.  States one MD did give him a new medication for his LE neuropathy that has helped  tremendously but doesn't know the name of it.  Admits to not doing his HEP but has been walking approx 1 mile evertyday.   BP/HR taken at beginning of session 133/75mmHg and 92 bpm.    Pertinent History very HOH, OSA, COPD, history of B knee replacements, poor medication compliance                          [facility] Adult PT Treatment/Exercise - 01/10/16 0001      Knee/Hip Exercises: Standing   Heel Raises Both;15 reps   Heel Raises Limitations toeraises 15 reps   Functional Squat 10 reps   SLS 3X each    Other Standing Knee Exercises tandem stance 15" X 2 each      Knee/Hip Exercises: Seated   Sit to Sand without UE support;5 reps     Knee/Hip Exercises: Supine   Bridges 10 reps   Bridges Limitations with hip abd using RTB   Straight Leg Raises Both;10 reps     Knee/Hip Exercises: Prone   Hamstring Curl 10 reps   Hip Extension 10 reps   Other Prone Exercises heelsqueezes 10 reps  PT Education - 01/10/16 0837    Education provided Yes   Education Details reviewed HEP, given copy of initial evaluation and reviewed goals.  Educated on breathing techniques   Person(s) Educated Patient   Methods Explanation;Demonstration;Tactile cues;Verbal cues;Handout   Comprehension Verbalized understanding;Returned demonstration;Verbal cues required;Tactile cues required          PT Short Term Goals - 12/30/15 1731      PT SHORT TERM GOAL #1   Title Patient to be able to verbally state 5/5 safety precautions/fall prevention strategies in order to improve general safety and reduce fall risk    Time 2   Period Weeks   Status New     PT SHORT TERM GOAL #2   Title Patient to be able to perform 10 sit to stands with no UEs and SOB/DOE no more than 3/10 in order to show improved functional strength and mobility    Time 2   Period Weeks   Status New     PT SHORT TERM GOAL #3   Title Patient to score at least 46 on BERG balance test in order to show  improved balance skills and reduced fall risk    Time 2   Period Weeks   Status New     PT SHORT TERM GOAL #4   Title Patient to be consistently and correctly performing appropriate HEP, to be updated PRN    Time 1   Period Weeks   Status New           PT Long Term Goals - 12/30/15 1734      PT LONG TERM GOAL #1   Title Patient to demonstrate functional strength 5/5 in order to improve general mobility and assist in advanced functional task performance with reduced fall risk    Time 4   Period Weeks   Status New     PT LONG TERM GOAL #2   Title Patient to score at least 51 on BERG balance test in order to show improved balance skills and reduced fall risk    Time 4   Period Weeks   Status New     PT LONG TERM GOAL #3   Title Patient to be able to perform a full floor to stand transfer with mod(I) and no cues from therapy staff, exertion/difficulty rating no more than 2/10, in order to improve general physical performance and mobility at home    Time 4   Period Weeks   Status New     PT LONG TERM GOAL #4   Title Patient to be participatory in appropriate regular aerobic exercise program, at least 20 minutes in duration, at least 4 days per week, in order to maintain functional gains and to improve general health status    Time 4   Period Weeks   Status New               Plan - 01/10/16 0900    Clinical Impression Statement Pt with busy week last week due to daughters scheduling him numerous MD appts in [place] and unable to complete his HEP.  Initiated new therex targeting improving hip strength.  Reviewed HEP and initial evaluation with cues to complete therex in correct form.  worked on breathing during therex to help decrease fatigue.  Pt unable to balance on one LE greater than 2 seconds and tandem stance greater than 15 seconds without use of UE's.  Rt lead tandem much more stable than Lt lead.  Rehab Potential Good   Clinical Impairments Affecting Rehab  Potential COPD, poor compliance with HTN medicine, likely chronic presentation of impairments    PT Frequency 2x / week   PT Duration 4 weeks   PT Treatment/Interventions ADLs/Self Care Home Management;Biofeedback;Cryotherapy;Moist Heat;DME Instruction;Gait training;Stair training;Functional mobility training;Therapeutic activities;Therapeutic exercise;Balance training;Neuromuscular re-education;Patient/family education;Manual techniques;Energy conservation;Taping   PT Next Visit Plan check BP/HR/O2 regularly. Functional strength, gait, and balance focus. Safety awareness training. CAUTION WITH ADDING TO HEP DUE TO COMPLIANCE CONCERNS.  PT hard of hearing.  Progress with standing static balance actvities.    PT Home Exercise Plan 10/5: modified to bridges with ABD into red TB, tandem stance with fingertip touch at counter    Consulted and Agree with Plan of Care Patient      Patient will benefit from skilled therapeutic intervention in order to improve the following deficits and impairments:  Abnormal gait, Improper body mechanics, Cardiopulmonary status limiting activity, Decreased mobility, Postural dysfunction, Decreased activity tolerance, Decreased strength, Decreased balance, Decreased safety awareness, Difficulty walking, Impaired flexibility  Visit Diagnosis: Muscle weakness (generalized)  Unsteadiness on feet  History of falling     Problem List Patient Active Problem List   Diagnosis Date Noted  . Obstructive sleep apnea 12/05/2010  . Rhinitis, chronic 12/05/2010  . COPD (chronic obstructive pulmonary disease) ([place]) 12/05/2010    Teena Irani, PTA/CLT [phone]  01/10/2016, 9:05 AM  [place], [place], [address] Phone: [phone]   Fax:  [phone]  Name: TRYTON HERTING MRN: [id number] Date of Birth: 08/26/1929

## 2016-01-11 DIAGNOSIS — I10 Essential (primary) hypertension: Secondary | ICD-10-CM | POA: Diagnosis not present

## 2016-01-11 DIAGNOSIS — E1140 Type 2 diabetes mellitus with diabetic neuropathy, unspecified: Secondary | ICD-10-CM | POA: Diagnosis not present

## 2016-01-11 DIAGNOSIS — Z23 Encounter for immunization: Secondary | ICD-10-CM | POA: Diagnosis not present

## 2016-01-11 DIAGNOSIS — E119 Type 2 diabetes mellitus without complications: Secondary | ICD-10-CM | POA: Diagnosis not present

## 2016-01-12 ENCOUNTER — Ambulatory Visit (HOSPITAL_COMMUNITY): Payer: Medicare Other | Admitting: Physical Therapy

## 2016-01-12 DIAGNOSIS — Z9181 History of falling: Secondary | ICD-10-CM | POA: Diagnosis not present

## 2016-01-12 DIAGNOSIS — R2681 Unsteadiness on feet: Secondary | ICD-10-CM | POA: Diagnosis not present

## 2016-01-12 DIAGNOSIS — M6281 Muscle weakness (generalized): Secondary | ICD-10-CM

## 2016-01-12 NOTE — Therapy (Signed)
[place], [place], [address] Phone: [phone]   Fax:  [phone]  Physical Therapy Treatment  Patient Details  Name: Alexander Young MRN: [id number] Date of Birth: [birth year] Referring Provider: Abundio Miu   Encounter Date: 01/12/2016      PT End of Session - 01/12/16 1033    Visit Number 3   Number of Visits 8   Date for PT Re-Evaluation 01/27/16   Authorization Type Medicare and BCBS Supplement    Authorization Time Period 12/30/15 to 01/30/16   Authorization - Visit Number 3   Authorization - Number of Visits 10   PT Start Time 0958   PT Stop Time 1036   PT Time Calculation (min) 38 min   Equipment Utilized During Treatment Gait belt   Activity Tolerance Patient tolerated treatment well   Behavior During Therapy [facility] for tasks assessed/performed      Past Medical History:  Diagnosis Date  . Cancer [facility])    prostate   . Cough   . Depression   . Hypertension   . Macular degeneration    left eye  . Sleep apnea         Past Surgical History:  Procedure Laterality Date  . COLONOSCOPY    . COLONOSCOPY N/A 02/27/2013   Procedure: COLONOSCOPY;  Surgeon: Rogene Houston, MD;  [facility];  Service: Endoscopy;  Laterality: N/A;  730  . OLECRANON BURSECTOMY Right 06/30/2014   Procedure: EXCISION RIGHT OLECRANON BURSA;  Surgeon: Sanjuana Kava, MD;  Location: [facility] ORS;  Service: Orthopedics;  Laterality: Right;  . PROSTATECTOMY    . SEPTOPLASTY    . TOTAL KNEE ARTHROPLASTY Bilateral    Done in [place] [facility]  . UVULOPALATOPHARYNGOPLASTY      There were no vitals filed for this visit.      Subjective Assessment - 01/12/16 1103    Subjective Pt was late for appointment.  States he is not having any pain or difficulties.  continues to walk and is attempting to get his HEP worked in daily.    Currently in Pain? No/denies                         [facility] Adult PT  Treatment/Exercise - 01/12/16 0001      Knee/Hip Exercises: Standing   Heel Raises Both;15 reps   Heel Raises Limitations toeraises 15 reps   Forward Lunges Both;10 reps   Forward Lunges Limitations onto 4" box without UE assist   Functional Squat 10 reps   SLS 3X each   max Rt 8", Lt 4"   SLS with Vectors 5X3" holds each LE with 1 HHA   Other Standing Knee Exercises tandem stance 30" X 2 each      Knee/Hip Exercises: Seated   Sit to Sand without UE support;10 reps     Knee/Hip Exercises: Supine   Bridges 10 reps   Straight Leg Raises Both;10 reps     Knee/Hip Exercises: Sidelying   Hip ABduction Both;10 reps     Knee/Hip Exercises: Prone   Hamstring Curl 10 reps   Hip Extension 10 reps                  PT Short Term Goals - 12/30/15 1731      PT SHORT TERM GOAL #1   Title Patient to be able to verbally state 5/5 safety precautions/fall prevention strategies in order to improve  general safety and reduce fall risk    Time 2   Period Weeks   Status New     PT SHORT TERM GOAL #2   Title Patient to be able to perform 10 sit to stands with no UEs and SOB/DOE no more than 3/10 in order to show improved functional strength and mobility    Time 2   Period Weeks   Status New     PT SHORT TERM GOAL #3   Title Patient to score at least 46 on BERG balance test in order to show improved balance skills and reduced fall risk    Time 2   Period Weeks   Status New     PT SHORT TERM GOAL #4   Title Patient to be consistently and correctly performing appropriate HEP, to be updated PRN    Time 1   Period Weeks   Status New           PT Long Term Goals - 12/30/15 1734      PT LONG TERM GOAL #1   Title Patient to demonstrate functional strength 5/5 in order to improve general mobility and assist in advanced functional task performance with reduced fall risk    Time 4   Period Weeks   Status New     PT LONG TERM GOAL #2   Title Patient to score at least 51 on  BERG balance test in order to show improved balance skills and reduced fall risk    Time 4   Period Weeks   Status New     PT LONG TERM GOAL #3   Title Patient to be able to perform a full floor to stand transfer with mod(I) and no cues from therapy staff, exertion/difficulty rating no more than 2/10, in order to improve general physical performance and mobility at home    Time 4   Period Weeks   Status New     PT LONG TERM GOAL #4   Title Patient to be participatory in appropriate regular aerobic exercise program, at least 20 minutes in duration, at least 4 days per week, in order to maintain functional gains and to improve general health status    Time 4   Period Weeks   Status New               Plan - 01/12/16 1058    Clinical Impression Statement Pt with reports of improvement in his legs so far.  Returned to Dr. Willey Blade for checkup and is doing well, meds are working good for his LE pain he was having.  Progressed with vector stance as he has difficulty maintaining SLS greater than 5 seconds and began lunges to improve LE stability.  Pt with improved form completing mat exercises this session as well.  BP and HR monitored mid treatment 141/81 mmHg and 106 bpm.  Oxygen sats at 92%.     Rehab Potential Good   Clinical Impairments Affecting Rehab Potential COPD, poor compliance with HTN medicine, likely chronic presentation of impairments    PT Frequency 2x / week   PT Duration 4 weeks   PT Treatment/Interventions ADLs/Self Care Home Management;Biofeedback;Cryotherapy;Moist Heat;DME Instruction;Gait training;Stair training;Functional mobility training;Therapeutic activities;Therapeutic exercise;Balance training;Neuromuscular re-education;Patient/family education;Manual techniques;Energy conservation;Taping   PT Next Visit Plan check BP/HR/O2 regularly. Functional strength, gait, and balance focus. Safety awareness training. CAUTION WITH ADDING TO HEP DUE TO COMPLIANCE CONCERNS.  PT  hard of hearing.  Progress with dynamic static balance actvities.  PT Home Exercise Plan 10/5: modified to bridges with ABD into red TB, tandem stance with fingertip touch at counter    Consulted and Agree with Plan of Care Patient      Patient will benefit from skilled therapeutic intervention in order to improve the following deficits and impairments:  Abnormal gait, Improper body mechanics, Cardiopulmonary status limiting activity, Decreased mobility, Postural dysfunction, Decreased activity tolerance, Decreased strength, Decreased balance, Decreased safety awareness, Difficulty walking, Impaired flexibility  Visit Diagnosis: Muscle weakness (generalized)  Unsteadiness on feet  History of falling     Problem List Patient Active Problem List   Diagnosis Date Noted  . Obstructive sleep apnea 12/05/2010  . Rhinitis, chronic 12/05/2010  . COPD (chronic obstructive pulmonary disease) ([place]) 12/05/2010    Teena Irani, PTA/CLT [phone]  01/12/2016, 11:04 AM  [place], [place], [address] Phone: [phone]   Fax:  [phone]  Name: Alexander Young MRN: [id number] Date of Birth: 06-Nov-1929

## 2016-01-18 ENCOUNTER — Ambulatory Visit (HOSPITAL_COMMUNITY): Payer: Medicare Other | Admitting: Physical Therapy

## 2016-01-18 DIAGNOSIS — R2681 Unsteadiness on feet: Secondary | ICD-10-CM | POA: Diagnosis not present

## 2016-01-18 DIAGNOSIS — M6281 Muscle weakness (generalized): Secondary | ICD-10-CM | POA: Diagnosis not present

## 2016-01-18 DIAGNOSIS — Z9181 History of falling: Secondary | ICD-10-CM

## 2016-01-18 NOTE — Therapy (Signed)
[place] [address] [place], [place], [address] Phone: [phone]   Fax:  [phone]  Physical Therapy Treatment  Patient Details  Name: Alexander Young MRN: [id number] Date of Birth: 1929-11-13 Referring Provider: Abundio Miu   Encounter Date: 01/18/2016      PT End of Session - 01/18/16 1205    Visit Number 4   Number of Visits 8   Date for PT Re-Evaluation 01/27/16   Authorization Type Medicare and BCBS Supplement    Authorization Time Period 12/30/15 to 01/30/16   Authorization - Visit Number 4   Authorization - Number of Visits 10   PT Start Time 1117   PT Stop Time 1157   PT Time Calculation (min) 40 min   Activity Tolerance Patient tolerated treatment well   Behavior During Therapy [facility] for tasks assessed/performed      Past Medical History:  Diagnosis Date  . Cancer [facility])    prostate   . Cough   . Depression   . Hypertension   . Macular degeneration    left eye  . Sleep apnea         Past Surgical History:  Procedure Laterality Date  . COLONOSCOPY    . COLONOSCOPY N/A 02/27/2013   Procedure: COLONOSCOPY;  Surgeon: Rogene Houston, MD;  Location: [facility];  Service: Endoscopy;  Laterality: N/A;  730  . OLECRANON BURSECTOMY Right 06/30/2014   Procedure: EXCISION RIGHT OLECRANON BURSA;  Surgeon: Sanjuana Kava, MD;  Location: [facility] ORS;  Service: Orthopedics;  Laterality: Right;  . PROSTATECTOMY    . SEPTOPLASTY    . TOTAL KNEE ARTHROPLASTY Bilateral    Done in [place] [facility]  . UVULOPALATOPHARYNGOPLASTY      There were no vitals filed for this visit.      Subjective Assessment - 01/18/16 1122    Subjective Patient arrives very pleasant today, reports no major changes since last session but has been sore since he started PT here    Pertinent History very HOH, OSA, COPD, history of B knee replacements, poor medication compliance    Currently in Pain? No/denies                          [facility] Adult PT Treatment/Exercise - 01/18/16 0001                   Knee/Hip Exercises: Standing   Heel Raises Both;1 set;20 reps   Heel Raises Limitations heel and toe    Forward Lunges Both;1 set;15 reps   Forward Lunges Limitations 4 inch box    Lateral Step Up 15 reps   Lateral Step Up Limitations 4 inch box    Forward Step Up 15 reps   Forward Step Up Limitations 4 inch box      Knee/Hip Exercises: Supine   Bridges 15 reps             Balance Exercises - 01/18/16 1148      Balance Exercises: Standing   Standing Eyes Closed Narrow base of support (BOS);3 reps;Foam/compliant surface   Tandem Stance Eyes open;Foam/compliant surface;3 reps;10 secs   SLS Eyes open;Solid surface;3 reps   Rockerboard Lateral;Anterior/posterior;UE support;Other (comment)  U UE support, x2 minutes each way            PT Education - 01/18/16 1205    Education provided No          PT Short Term Goals -  12/30/15 1731      PT SHORT TERM GOAL #1   Title Patient to be able to verbally state 5/5 safety precautions/fall prevention strategies in order to improve general safety and reduce fall risk    Time 2   Period Weeks   Status New     PT SHORT TERM GOAL #2   Title Patient to be able to perform 10 sit to stands with no UEs and SOB/DOE no more than 3/10 in order to show improved functional strength and mobility    Time 2   Period Weeks   Status New     PT SHORT TERM GOAL #3   Title Patient to score at least 46 on BERG balance test in order to show improved balance skills and reduced fall risk    Time 2   Period Weeks   Status New     PT SHORT TERM GOAL #4   Title Patient to be consistently and correctly performing appropriate HEP, to be updated PRN    Time 1   Period Weeks   Status New           PT Long Term Goals - 12/30/15 1734      PT LONG TERM GOAL #1   Title Patient to demonstrate functional strength 5/5 in order to improve general mobility and assist in  advanced functional task performance with reduced fall risk    Time 4   Period Weeks   Status New     PT LONG TERM GOAL #2   Title Patient to score at least 51 on BERG balance test in order to show improved balance skills and reduced fall risk    Time 4   Period Weeks   Status New     PT LONG TERM GOAL #3   Title Patient to be able to perform a full floor to stand transfer with mod(I) and no cues from therapy staff, exertion/difficulty rating no more than 2/10, in order to improve general physical performance and mobility at home    Time 4   Period Weeks   Status New     PT LONG TERM GOAL #4   Title Patient to be participatory in appropriate regular aerobic exercise program, at least 20 minutes in duration, at least 4 days per week, in order to maintain functional gains and to improve general health status    Time 4   Period Weeks   Status New               Plan - 01/18/16 1205    Clinical Impression Statement Patient arrives stating he is doing well but feels sore from working out, he has been walking regularly. Continued focus on functional strength and balance today, noting some likely soft tissue limitations around the hips that are possibly impacting function, strength, and balance. Patient did require two rest breaks during today's session due to fatigue but did well overall, continues to struggle with balance based activities.    Rehab Potential Good   Clinical Impairments Affecting Rehab Potential COPD, poor compliance with HTN medicine, likely chronic presentation of impairments    PT Frequency 2x / week   PT Duration 4 weeks   PT Treatment/Interventions ADLs/Self Care Home Management;Biofeedback;Cryotherapy;Moist Heat;DME Instruction;Gait training;Stair training;Functional mobility training;Therapeutic activities;Therapeutic exercise;Balance training;Neuromuscular re-education;Patient/family education;Manual techniques;Energy conservation;Taping   PT Next Visit Plan  check BP/HR/O2 regularly. Functional strength, gait, and balance focus. Safety awareness training. CAUTION WITH ADDING TO HEP DUE TO COMPLIANCE CONCERNS.  PT  hard of hearing.  Progress with dynamic static balance actvities.    PT Home Exercise Plan 10/5: modified to bridges with ABD into red TB, tandem stance with fingertip touch at counter    Consulted and Agree with Plan of Care Patient      Patient will benefit from skilled therapeutic intervention in order to improve the following deficits and impairments:  Abnormal gait, Improper body mechanics, Cardiopulmonary status limiting activity, Decreased mobility, Postural dysfunction, Decreased activity tolerance, Decreased strength, Decreased balance, Decreased safety awareness, Difficulty walking, Impaired flexibility  Visit Diagnosis: Muscle weakness (generalized)  Unsteadiness on feet  History of falling     Problem List Patient Active Problem List   Diagnosis Date Noted  . Obstructive sleep apnea 12/05/2010  . Rhinitis, chronic 12/05/2010  . COPD (chronic obstructive pulmonary disease) ([place]) 12/05/2010    Deniece Ree PT, DPT [place] [address] [place], [place], [address] Phone: [phone]   Fax:  [phone]  Name: Alexander Young MRN: [id number] Date of Birth: 11/18/1929

## 2016-01-20 ENCOUNTER — Telehealth (HOSPITAL_COMMUNITY): Payer: Self-pay | Admitting: Physical Therapy

## 2016-01-20 ENCOUNTER — Ambulatory Visit (HOSPITAL_COMMUNITY): Payer: Medicare Other

## 2016-01-20 VITALS — BP 139/88 | HR 96

## 2016-01-20 DIAGNOSIS — R2681 Unsteadiness on feet: Secondary | ICD-10-CM | POA: Diagnosis not present

## 2016-01-20 DIAGNOSIS — M6281 Muscle weakness (generalized): Secondary | ICD-10-CM | POA: Diagnosis not present

## 2016-01-20 DIAGNOSIS — Z9181 History of falling: Secondary | ICD-10-CM

## 2016-01-20 NOTE — Therapy (Signed)
[place], [place], [address] Phone: [phone]   Fax:  [phone]  Physical Therapy Treatment  Patient Details  Name: Alexander Young MRN: [id number] Date of Birth: November 24, 1929 Referring Provider: Abundio Miu   Encounter Date: 01/20/2016      PT End of Session - 01/20/16 0957    Visit Number 5   Number of Visits 8   Date for PT Re-Evaluation 01/27/16   Authorization Type Medicare and BCBS Supplement    Authorization Time Period 12/30/15 to 01/30/16   Authorization - Visit Number 5   Authorization - Number of Visits 10   PT Start Time 0948   PT Stop Time 1028   PT Time Calculation (min) 40 min   Activity Tolerance Patient tolerated treatment well   Behavior During Therapy [facility] for tasks assessed/performed      Past Medical History:  Diagnosis Date  . Cancer [facility])    prostate   . Cough   . Depression   . Hypertension   . Macular degeneration    left eye  . Sleep apnea         Past Surgical History:  Procedure Laterality Date  . COLONOSCOPY    . COLONOSCOPY N/A 02/27/2013   Procedure: COLONOSCOPY;  Surgeon: Rogene Houston, MD;  Location: [facility] ENDO SUITE;  Service: Endoscopy;  Laterality: N/A;  730  . OLECRANON BURSECTOMY Right 06/30/2014   Procedure: EXCISION RIGHT OLECRANON BURSA;  Surgeon: Sanjuana Kava, MD;  Location: [facility] ORS;  Service: Orthopedics;  Laterality: Right;  . PROSTATECTOMY    . SEPTOPLASTY    . TOTAL KNEE ARTHROPLASTY Bilateral    [place] [facility]  . UVULOPALATOPHARYNGOPLASTY      Vitals:   01/20/16 1004  BP: 139/88  Pulse: 96  SpO2: 97%        Subjective Assessment - 01/20/16 0951    Subjective Pt doing well today. He reports no pain att his time. He says hes walking mroe at home and its getting easier.    Pertinent History very HOH, OSA, COPD, history of B knee replacements, poor medication compliance    Currently in Pain? No/denies                          [facility] Adult PT Treatment/Exercise - 01/20/16 0001      Ambulation/Gait   Gait Comments retroAMB 2x73ft; Rt-side step 2x53ft; Lt sidestep 2x35ft     Knee/Hip Exercises: Standing   Heel Raises Both;20 reps;2 sets   Heel Raises Limitations heel and toe    Lateral Step Up 10 reps;2 sets;Both;Step Height: 6";Hand Hold: 1   Forward Step Up 10 reps;Step Height: 6";Both;2 sets   Functional Squat 2 sets;10 reps  sit t0 stands from chair   SLS 10x3sH, alternatign bilat   Other Standing Knee Exercises tandem stance 30" X 3 each    Other Standing Knee Exercises narrow stance ball rebounds overhead, 50ft away x20     Knee/Hip Exercises: Seated   Ball Squeeze overhead reach with volleyball; seated x15                  PT Short Term Goals - 12/30/15 1731      PT SHORT TERM GOAL #1   Title Patient to be able to verbally state 5/5 safety precautions/fall prevention strategies in order to improve general safety and reduce fall risk    Time 2  Period Weeks   Status New     PT SHORT TERM GOAL #2   Title Patient to be able to perform 10 sit to stands with no UEs and SOB/DOE no more than 3/10 in order to show improved functional strength and mobility    Time 2   Period Weeks   Status New     PT SHORT TERM GOAL #3   Title Patient to score at least 46 on BERG balance test in order to show improved balance skills and reduced fall risk    Time 2   Period Weeks   Status New     PT SHORT TERM GOAL #4   Title Patient to be consistently and correctly performing appropriate HEP, to be updated PRN    Time 1   Period Weeks   Status New           PT Long Term Goals - 12/30/15 1734      PT LONG TERM GOAL #1   Title Patient to demonstrate functional strength 5/5 in order to improve general mobility and assist in advanced functional task performance with reduced fall risk    Time 4   Period Weeks   Status New     PT LONG TERM GOAL #2   Title  Patient to score at least 51 on BERG balance test in order to show improved balance skills and reduced fall risk    Time 4   Period Weeks   Status New     PT LONG TERM GOAL #3   Title Patient to be able to perform a full floor to stand transfer with mod(I) and no cues from therapy staff, exertion/difficulty rating no more than 2/10, in order to improve general physical performance and mobility at home    Time 4   Period Weeks   Status New     PT LONG TERM GOAL #4   Title Patient to be participatory in appropriate regular aerobic exercise program, at least 20 minutes in duration, at least 4 days per week, in order to maintain functional gains and to improve general health status    Time 4   Period Weeks   Status New               Plan - 01/20/16 [id number]    Clinical Impression Statement Pt toelrating session well with minimal fatigue. Pt is making progress toward goals showing improvement in strength and activity tolerance. Pt still demonstrates a fair amount of weakness in hips, most noted during balance attempts, but this area continues to improve.  VSS throughout treatment session.  Stepping activity appears less stable on the left side.  Lateral and retro gait also seem to be the most difficult for the patient, with difficulty maintaining a stepthrough gait in retro, especially on the L.    Rehab Potential Good   Clinical Impairments Affecting Rehab Potential COPD, poor compliance with HTN medicine, likely chronic presentation of impairments    PT Frequency 2x / week   PT Duration 4 weeks   PT Treatment/Interventions ADLs/Self Care Home Management;Biofeedback;Cryotherapy;Moist Heat;DME Instruction;Gait training;Stair training;Functional mobility training;Therapeutic activities;Therapeutic exercise;Balance training;Neuromuscular re-education;Patient/family education;Manual techniques;Energy conservation;Taping   PT Next Visit Plan No major changes at this time, progress current  progress as able. Monitor BP/HR/O2 regularly. Continue with functional strength, gait, and balance focus. Safety awareness training. CAUTION WITH ADDING TO HEP DUE TO COMPLIANCE CONCERNS.  PT hard of hearing.  Progress with dynamic static balance actvities.    PT  Home Exercise Plan 10/5: modified to bridges with ABD into red TB, tandem stance with fingertip touch at counter    Consulted and Agree with Plan of Care Patient      Patient will benefit from skilled therapeutic intervention in order to improve the following deficits and impairments:  Abnormal gait, Improper body mechanics, Cardiopulmonary status limiting activity, Decreased mobility, Postural dysfunction, Decreased activity tolerance, Decreased strength, Decreased balance, Decreased safety awareness, Difficulty walking, Impaired flexibility  Visit Diagnosis: Muscle weakness (generalized)  Unsteadiness on feet  History of falling     Problem List Patient Active Problem List   Diagnosis Date Noted  . Obstructive sleep apnea 12/05/2010  . Rhinitis, chronic 12/05/2010  . COPD (chronic obstructive pulmonary disease) ([place]) 12/05/2010    10:29 AM, 01/20/16 Etta Grandchild, PT, DPT Physical Therapist at [facility] [phone] (office)     [place], [place], [address] Phone: [phone]   Fax:  [phone]  Name: Alexander Young MRN: [id number] Date of Birth: [birth year]

## 2016-01-20 NOTE — Telephone Encounter (Signed)
Patient agreeable to coming in at 9:45 instead of 9am appointment today.   Deniece Ree PT, DPT [phone]

## 2016-01-24 ENCOUNTER — Telehealth (HOSPITAL_COMMUNITY): Payer: Self-pay | Admitting: Internal Medicine

## 2016-01-24 NOTE — Telephone Encounter (Signed)
Patient is leaving town and will not be back until next week.

## 2016-01-25 ENCOUNTER — Ambulatory Visit (HOSPITAL_COMMUNITY): Payer: Medicare Other | Admitting: Physical Therapy

## 2016-01-26 DIAGNOSIS — Z8546 Personal history of malignant neoplasm of prostate: Secondary | ICD-10-CM | POA: Diagnosis not present

## 2016-01-26 DIAGNOSIS — Z720 Tobacco use: Secondary | ICD-10-CM | POA: Diagnosis not present

## 2016-01-26 DIAGNOSIS — R591 Generalized enlarged lymph nodes: Secondary | ICD-10-CM | POA: Diagnosis not present

## 2016-01-26 NOTE — Telephone Encounter (Signed)
Information faxed

## 2016-01-26 NOTE — Telephone Encounter (Signed)
Called to get any records like office note, labs, and radiology reports on patient. Requested the nurse fax them with attention to brook.  Phone# [phone]  Fax# [phone]

## 2016-01-27 ENCOUNTER — Encounter (HOSPITAL_COMMUNITY): Payer: Medicare Other | Admitting: Physical Therapy

## 2016-01-28 DIAGNOSIS — H353221 Exudative age-related macular degeneration, left eye, with active choroidal neovascularization: Secondary | ICD-10-CM | POA: Diagnosis not present

## 2016-02-01 ENCOUNTER — Ambulatory Visit (HOSPITAL_COMMUNITY): Payer: Medicare Other | Attending: Orthopedic Surgery | Admitting: Physical Therapy

## 2016-02-01 DIAGNOSIS — M6281 Muscle weakness (generalized): Secondary | ICD-10-CM | POA: Diagnosis not present

## 2016-02-01 DIAGNOSIS — Z9181 History of falling: Secondary | ICD-10-CM | POA: Insufficient documentation

## 2016-02-01 DIAGNOSIS — R2681 Unsteadiness on feet: Secondary | ICD-10-CM | POA: Insufficient documentation

## 2016-02-01 NOTE — Therapy (Signed)
[place], [place], [address] Phone: [phone]   Fax:  [phone]  Physical Therapy Treatment (Discharge)  Patient Details  Name: Alexander Young MRN: [id number] Date of Birth: Feb 19, 1930 Referring Provider: Abundio Miu   Encounter Date: 02/01/2016      PT End of Session - 02/01/16 1041    Visit Number 6   Number of Visits 8   Date for PT Re-Evaluation 01/27/16   Authorization Type Medicare and BCBS Supplement    Authorization Time Period 12/30/15 to 01/30/16   Authorization - Visit Number 6   Authorization - Number of Visits 10   PT Start Time 1287   PT Stop Time 1108   PT Time Calculation (min) 33 min   Activity Tolerance Patient tolerated treatment well   Behavior During Therapy [facility] for tasks assessed/performed      Past Medical History:  Diagnosis Date  . Cancer [facility])    prostate   . Cough   . Depression   . Hypertension   . Macular degeneration    left eye  . Sleep apnea         Past Surgical History:  Procedure Laterality Date  . COLONOSCOPY    . COLONOSCOPY N/A 02/27/2013   Procedure: COLONOSCOPY;  Surgeon: Rogene Houston, MD;  Location[facility];  Service: Endoscopy;  Laterality: N/A;  730  . OLECRANON BURSECTOMY Right 06/30/2014   Procedure: EXCISION RIGHT OLECRANON BURSA;  Surgeon: Sanjuana Kava, MD;  Location: [facility] ORS;  Service: Orthopedics;  Laterality: Right;  . PROSTATECTOMY    . SEPTOPLASTY    . TOTAL KNEE ARTHROPLASTY Bilateral    [place] [facility]  . UVULOPALATOPHARYNGOPLASTY      There were no vitals filed for this visit.      Subjective Assessment - 02/01/16 1115    Subjective Pt states he has been busy with MD appts in [place], [place] but is doing his HEP, walking and is overall better.  PT feels he is ready for discharge and plans to join the [facility].  no pain.   Currently in Pain? No/denies            [facility] PT Assessment - 02/01/16 0001      Assessment   Medical Diagnosis muscle weakness, peripheral neuropathy      Strength   Right Hip Flexion 5/5  wasa 4/5   Right Hip Extension 4+/5  was 3/5   Right Hip ABduction 5/5  was 4/5   Left Hip Flexion 5/5  was 4/5   Left Hip Extension 4+/5  was 3/5   Left Hip ABduction 5/5  was 4/5   Right Knee Flexion 5/5  was 4+/5   Right Knee Extension 5/5  was 5/5   Left Knee Flexion 5/5  was 4+/5   Left Knee Extension 5/5  was 5/5   Right Ankle Dorsiflexion 5/5  was 4/5   Left Ankle Dorsiflexion 5/5  was 4+/5     6 minute walk test results    Aerobic Endurance Distance Walked 652  was 564 feet   Endurance additional comments 3MWT     Berg Balance Test   Sit to Stand Able to stand without using hands and stabilize independently   Standing Unsupported Able to stand safely 2 minutes   Sitting with Back Unsupported but Feet Supported on Floor or Stool Able to sit safely and securely 2 minutes   Stand to Sit Sits safely with  minimal use of hands   Transfers Able to transfer safely, minor use of hands   Standing Unsupported with Eyes Closed Able to stand 10 seconds safely   Standing Ubsupported with Feet Together Able to place feet together independently and stand 1 minute safely   From Standing, Reach Forward with Outstretched Arm Can reach confidently >25 cm (10")   From Standing Position, Pick up Object from Floor Able to pick up shoe safely and easily   From Standing Position, Turn to Look Behind Over each Shoulder Looks behind one side only/other side shows less weight shift  pt with "crick" in his neck   Turn 360 Degrees Able to turn 360 degrees safely one side only in 4 seconds or less   Standing Unsupported, Alternately Place Feet on Step/Stool Able to stand independently and safely and complete 8 steps in 20 seconds   Standing Unsupported, One Foot in Front Able to take small step independently and hold 30 seconds   Standing on One Leg Able to lift leg independently and hold 5-10  seconds   Total Score 51                             PT Education - 02/01/16 1112    Education provided Yes   Education Details PT educated to continue single leg balance, tandem stance at counter and walking program.  to become a [employer] member as well.   Person(s) Educated Patient   Methods Explanation   Comprehension Verbalized understanding          PT Short Term Goals - 02/01/16 1100      PT SHORT TERM GOAL #1   Title Patient to be able to verbally state 5/5 safety precautions/fall prevention strategies in order to improve general safety and reduce fall risk    Time 2   Period Weeks   Status Achieved     PT SHORT TERM GOAL #2   Title Patient to be able to perform 10 sit to stands with no UEs and SOB/DOE no more than 3/10 in order to show improved functional strength and mobility    Time 2   Period Weeks   Status Achieved     PT SHORT TERM GOAL #3   Title Patient to score at least 46 on BERG balance test in order to show improved balance skills and reduced fall risk    Time 2   Period Weeks   Status Achieved     PT SHORT TERM GOAL #4   Title Patient to be consistently and correctly performing appropriate HEP, to be updated PRN    Time 1   Period Weeks   Status Achieved           PT Long Term Goals - 02/01/16 1101      PT LONG TERM GOAL #1   Title Patient to demonstrate functional strength 5/5 in order to improve general mobility and assist in advanced functional task performance with reduced fall risk    Time 4   Period Weeks   Status Partially Met     PT LONG TERM GOAL #2   Title Patient to score at least 51 on BERG balance test in order to show improved balance skills and reduced fall risk    Time 4   Period Weeks   Status Achieved     PT LONG TERM GOAL #3   Title Patient to be able to perform a  full floor to stand transfer with mod(I) and no cues from therapy staff, exertion/difficulty rating no more than 2/10, in order to improve  general physical performance and mobility at home    Time 4   Period Weeks   Status Not Met     PT LONG TERM GOAL #4   Title Patient to be participatory in appropriate regular aerobic exercise program, at least 20 minutes in duration, at least 4 days per week, in order to maintain functional gains and to improve general health status    Time 4   Period Weeks   Status Achieved               Plan - 02/13/16 1109    Clinical Impression Statement Pt has completed 6/8 PT visits and expresses request to discontinue services at this time as he is 90% improved.  Pt now with functional strength gains and 10 point improvement in BERG balance test.  Pt is ambulating community distances without AD and ambulating 20-30 minutes every other day for exercise.  Pt has met all goals except floor to stand transfer and states he plans to join the [facility] next week. Pt is independent with HEP. Along with progression/goals met patient also requests discharge at this time.    Rehab Potential Good   Clinical Impairments Affecting Rehab Potential COPD, poor compliance with HTN medicine, likely chronic presentation of impairments    PT Frequency 2x / week   PT Duration 4 weeks   PT Treatment/Interventions ADLs/Self Care Home Management;Biofeedback;Cryotherapy;Moist Heat;DME Instruction;Gait training;Stair training;Functional mobility training;Therapeutic activities;Therapeutic exercise;Balance training;Neuromuscular re-education;Patient/family education;Manual techniques;Energy conservation;Taping   PT Next Visit Plan discharge per goals met and patient request.   PT Home Exercise Plan 10/5: modified to bridges with ABD into red TB, tandem stance with fingertip touch at counter 02-13-2023:  tandem stance at counter, SLS at counter   Consulted and Agree with Plan of Care Patient      Patient will benefit from skilled therapeutic intervention in order to improve the following deficits and impairments:  Abnormal gait,  Improper body mechanics, Cardiopulmonary status limiting activity, Decreased mobility, Postural dysfunction, Decreased activity tolerance, Decreased strength, Decreased balance, Decreased safety awareness, Difficulty walking, Impaired flexibility  Visit Diagnosis: Muscle weakness (generalized)  Unsteadiness on feet  History of falling     Problem List Patient Active Problem List   Diagnosis Date Noted  . Obstructive sleep apnea 12/05/2010  . Rhinitis, chronic 12/05/2010  . COPD (chronic obstructive pulmonary disease) ([place]) 12/05/2010    Teena Irani, PTA/CLT [phone]  G-codes 02-13-16 Mobility and walking around  Goal CJ DC CI  Based on strength, balance, gait    PHYSICAL THERAPY DISCHARGE SUMMARY  Visits from Start of Care: 6  Current functional level related to goals / functional outcomes: Patient requests to be discharged at this time, feels he is able to do everything he needs and wants to do right now and plans to join [facility] soon. DC per patient request.    Remaining deficits: Mild unsteadiness    Education / Equipment: DC today per request, HEP updates  Plan: Patient agrees to discharge.  Patient goals were met. Patient is being discharged due to the patient's request.  ?????     Deniece Ree PT, DPT [phone]  2016/02/13, 11:16 AM  [place], [place], [address] Phone: [phone]   Fax:  [phone]  Name: LOUI MASSENBURG MRN: [id number] Date of Birth: 04-17-1929

## 2016-02-02 ENCOUNTER — Encounter (HOSPITAL_COMMUNITY): Payer: Medicare Other | Admitting: Hematology & Oncology

## 2016-02-03 ENCOUNTER — Ambulatory Visit (HOSPITAL_COMMUNITY): Payer: Medicare Other | Admitting: Physical Therapy

## 2016-02-03 DIAGNOSIS — D487 Neoplasm of uncertain behavior of other specified sites: Secondary | ICD-10-CM | POA: Diagnosis not present

## 2016-02-03 DIAGNOSIS — R938 Abnormal findings on diagnostic imaging of other specified body structures: Secondary | ICD-10-CM | POA: Diagnosis not present

## 2016-02-03 DIAGNOSIS — C8512 Unspecified B-cell lymphoma, intrathoracic lymph nodes: Secondary | ICD-10-CM | POA: Diagnosis not present

## 2016-02-03 DIAGNOSIS — F17200 Nicotine dependence, unspecified, uncomplicated: Secondary | ICD-10-CM | POA: Diagnosis not present

## 2016-02-24 ENCOUNTER — Ambulatory Visit (INDEPENDENT_AMBULATORY_CARE_PROVIDER_SITE_OTHER): Payer: Medicare Other | Admitting: Otolaryngology

## 2016-02-24 DIAGNOSIS — J310 Chronic rhinitis: Secondary | ICD-10-CM

## 2016-02-24 DIAGNOSIS — H6123 Impacted cerumen, bilateral: Secondary | ICD-10-CM

## 2016-02-24 DIAGNOSIS — J342 Deviated nasal septum: Secondary | ICD-10-CM

## 2016-02-28 DIAGNOSIS — H353221 Exudative age-related macular degeneration, left eye, with active choroidal neovascularization: Secondary | ICD-10-CM | POA: Diagnosis not present

## 2016-02-28 DIAGNOSIS — H353133 Nonexudative age-related macular degeneration, bilateral, advanced atrophic without subfoveal involvement: Secondary | ICD-10-CM | POA: Diagnosis not present

## 2016-02-28 DIAGNOSIS — H35722 Serous detachment of retinal pigment epithelium, left eye: Secondary | ICD-10-CM | POA: Diagnosis not present

## 2016-02-28 DIAGNOSIS — H35352 Cystoid macular degeneration, left eye: Secondary | ICD-10-CM | POA: Diagnosis not present

## 2016-03-24 DIAGNOSIS — C8510 Unspecified B-cell lymphoma, unspecified site: Secondary | ICD-10-CM | POA: Diagnosis not present

## 2016-03-29 DIAGNOSIS — D472 Monoclonal gammopathy: Secondary | ICD-10-CM | POA: Diagnosis not present

## 2016-03-29 DIAGNOSIS — R590 Localized enlarged lymph nodes: Secondary | ICD-10-CM | POA: Diagnosis not present

## 2016-04-04 DIAGNOSIS — H353221 Exudative age-related macular degeneration, left eye, with active choroidal neovascularization: Secondary | ICD-10-CM | POA: Diagnosis not present

## 2016-04-06 DIAGNOSIS — C8510 Unspecified B-cell lymphoma, unspecified site: Secondary | ICD-10-CM | POA: Diagnosis not present

## 2016-04-06 DIAGNOSIS — Z87891 Personal history of nicotine dependence: Secondary | ICD-10-CM | POA: Diagnosis not present

## 2016-04-06 DIAGNOSIS — G629 Polyneuropathy, unspecified: Secondary | ICD-10-CM | POA: Diagnosis not present

## 2016-05-11 DIAGNOSIS — H35722 Serous detachment of retinal pigment epithelium, left eye: Secondary | ICD-10-CM | POA: Diagnosis not present

## 2016-05-11 DIAGNOSIS — H353221 Exudative age-related macular degeneration, left eye, with active choroidal neovascularization: Secondary | ICD-10-CM | POA: Diagnosis not present

## 2016-05-11 DIAGNOSIS — H353133 Nonexudative age-related macular degeneration, bilateral, advanced atrophic without subfoveal involvement: Secondary | ICD-10-CM | POA: Diagnosis not present

## 2016-05-11 DIAGNOSIS — H35352 Cystoid macular degeneration, left eye: Secondary | ICD-10-CM | POA: Diagnosis not present

## 2016-06-05 DIAGNOSIS — E119 Type 2 diabetes mellitus without complications: Secondary | ICD-10-CM | POA: Diagnosis not present

## 2016-06-13 DIAGNOSIS — Z6829 Body mass index (BMI) 29.0-29.9, adult: Secondary | ICD-10-CM | POA: Diagnosis not present

## 2016-06-13 DIAGNOSIS — I10 Essential (primary) hypertension: Secondary | ICD-10-CM | POA: Diagnosis not present

## 2016-06-13 DIAGNOSIS — E1140 Type 2 diabetes mellitus with diabetic neuropathy, unspecified: Secondary | ICD-10-CM | POA: Diagnosis not present

## 2016-06-22 DIAGNOSIS — H353221 Exudative age-related macular degeneration, left eye, with active choroidal neovascularization: Secondary | ICD-10-CM | POA: Diagnosis not present

## 2016-06-22 DIAGNOSIS — H35722 Serous detachment of retinal pigment epithelium, left eye: Secondary | ICD-10-CM | POA: Diagnosis not present

## 2016-07-12 DIAGNOSIS — R590 Localized enlarged lymph nodes: Secondary | ICD-10-CM | POA: Diagnosis not present

## 2016-07-12 DIAGNOSIS — D472 Monoclonal gammopathy: Secondary | ICD-10-CM | POA: Diagnosis not present

## 2016-07-27 DIAGNOSIS — H353221 Exudative age-related macular degeneration, left eye, with active choroidal neovascularization: Secondary | ICD-10-CM | POA: Diagnosis not present

## 2016-08-07 ENCOUNTER — Ambulatory Visit (INDEPENDENT_AMBULATORY_CARE_PROVIDER_SITE_OTHER): Payer: Medicare Other | Admitting: Otolaryngology

## 2016-09-07 DIAGNOSIS — H35722 Serous detachment of retinal pigment epithelium, left eye: Secondary | ICD-10-CM | POA: Diagnosis not present

## 2016-09-07 DIAGNOSIS — H353123 Nonexudative age-related macular degeneration, left eye, advanced atrophic without subfoveal involvement: Secondary | ICD-10-CM | POA: Diagnosis not present

## 2016-09-07 DIAGNOSIS — H35352 Cystoid macular degeneration, left eye: Secondary | ICD-10-CM | POA: Diagnosis not present

## 2016-09-07 DIAGNOSIS — H353222 Exudative age-related macular degeneration, left eye, with inactive choroidal neovascularization: Secondary | ICD-10-CM | POA: Diagnosis not present

## 2016-09-11 DIAGNOSIS — C61 Malignant neoplasm of prostate: Secondary | ICD-10-CM | POA: Diagnosis not present

## 2016-09-11 DIAGNOSIS — Z125 Encounter for screening for malignant neoplasm of prostate: Secondary | ICD-10-CM | POA: Diagnosis not present

## 2016-09-11 DIAGNOSIS — N529 Male erectile dysfunction, unspecified: Secondary | ICD-10-CM | POA: Diagnosis not present

## 2016-09-11 DIAGNOSIS — I10 Essential (primary) hypertension: Secondary | ICD-10-CM | POA: Diagnosis not present

## 2016-09-11 DIAGNOSIS — M1990 Unspecified osteoarthritis, unspecified site: Secondary | ICD-10-CM | POA: Diagnosis not present

## 2016-09-11 DIAGNOSIS — Z79899 Other long term (current) drug therapy: Secondary | ICD-10-CM | POA: Diagnosis not present

## 2016-09-11 DIAGNOSIS — G4730 Sleep apnea, unspecified: Secondary | ICD-10-CM | POA: Diagnosis not present

## 2016-09-11 DIAGNOSIS — E119 Type 2 diabetes mellitus without complications: Secondary | ICD-10-CM | POA: Diagnosis not present

## 2016-10-09 ENCOUNTER — Ambulatory Visit (INDEPENDENT_AMBULATORY_CARE_PROVIDER_SITE_OTHER): Payer: Medicare Other | Admitting: Otolaryngology

## 2016-10-09 DIAGNOSIS — H6123 Impacted cerumen, bilateral: Secondary | ICD-10-CM | POA: Diagnosis not present

## 2016-10-17 ENCOUNTER — Other Ambulatory Visit (HOSPITAL_COMMUNITY): Payer: Self-pay | Admitting: Internal Medicine

## 2016-10-17 DIAGNOSIS — I4891 Unspecified atrial fibrillation: Secondary | ICD-10-CM | POA: Diagnosis not present

## 2016-10-17 DIAGNOSIS — I10 Essential (primary) hypertension: Secondary | ICD-10-CM | POA: Diagnosis not present

## 2016-10-17 DIAGNOSIS — Z6831 Body mass index (BMI) 31.0-31.9, adult: Secondary | ICD-10-CM | POA: Diagnosis not present

## 2016-10-17 DIAGNOSIS — E119 Type 2 diabetes mellitus without complications: Secondary | ICD-10-CM | POA: Diagnosis not present

## 2016-10-19 DIAGNOSIS — H35352 Cystoid macular degeneration, left eye: Secondary | ICD-10-CM | POA: Diagnosis not present

## 2016-10-19 DIAGNOSIS — H35722 Serous detachment of retinal pigment epithelium, left eye: Secondary | ICD-10-CM | POA: Diagnosis not present

## 2016-10-19 DIAGNOSIS — H353221 Exudative age-related macular degeneration, left eye, with active choroidal neovascularization: Secondary | ICD-10-CM | POA: Diagnosis not present

## 2016-10-19 DIAGNOSIS — H353222 Exudative age-related macular degeneration, left eye, with inactive choroidal neovascularization: Secondary | ICD-10-CM | POA: Diagnosis not present

## 2016-10-19 DIAGNOSIS — H43822 Vitreomacular adhesion, left eye: Secondary | ICD-10-CM | POA: Diagnosis not present

## 2016-10-20 ENCOUNTER — Ambulatory Visit (HOSPITAL_COMMUNITY)
Admission: RE | Admit: 2016-10-20 | Discharge: 2016-10-20 | Disposition: A | Discharge status: Home / Self Care | Payer: Medicare Other | Source: Ambulatory Visit | Attending: Internal Medicine | Admitting: Internal Medicine

## 2016-10-20 DIAGNOSIS — I517 Cardiomegaly: Secondary | ICD-10-CM | POA: Insufficient documentation

## 2016-10-20 DIAGNOSIS — J449 Chronic obstructive pulmonary disease, unspecified: Secondary | ICD-10-CM | POA: Insufficient documentation

## 2016-10-20 DIAGNOSIS — I361 Nonrheumatic tricuspid (valve) insufficiency: Secondary | ICD-10-CM | POA: Diagnosis not present

## 2016-10-20 DIAGNOSIS — G4733 Obstructive sleep apnea (adult) (pediatric): Secondary | ICD-10-CM | POA: Diagnosis not present

## 2016-10-20 DIAGNOSIS — I4891 Unspecified atrial fibrillation: Secondary | ICD-10-CM | POA: Diagnosis not present

## 2016-10-20 DIAGNOSIS — I358 Other nonrheumatic aortic valve disorders: Secondary | ICD-10-CM | POA: Diagnosis not present

## 2016-10-20 LAB — ECHOCARDIOGRAM COMPLETE
CHL CUP DOP CALC LVOT VTI: 18.1 cm
CHL CUP STROKE VOLUME: 41 mL
EWDT: 215 ms
FS: 38 % (ref 28–44)
IV/PV OW: 1.06
LA ID, A-P, ES: 41 mm
LA diam end sys: 41 mm
LA diam index: 1.92 cm/m2
LAVOL: 53.4 mL
LAVOLA4C: 53.8 mL
LAVOLIN: 25 mL/m2
LDCA: 3.14 cm2
LV PW d: 10.4 mm — AB (ref 0.6–1.1)
LV SIMPSON'S DISK: 70
LV dias vol index: 27 mL/m2
LV sys vol index: 8 mL/m2
LV sys vol: 17 mL — AB
LVDIAVOL: 58 mL — AB (ref 62–150)
LVOT SV: 57 mL
LVOT peak grad rest: 4 mmHg
LVOT peak vel: 94 cm/s
LVOTD: 20 mm
MV Dec: 215
MV pk E vel: 90.8 m/s
MVPG: 3 mmHg
RV TAPSE: 17.8 mm
RV sys press: 34 mmHg
Reg peak vel: 279 cm/s
TR max vel: 279 cm/s

## 2016-10-20 NOTE — Progress Notes (Signed)
*  PRELIMINARY RESULTS* Echocardiogram 2D Echocardiogram has been performed.  Alexander Young 10/20/2016, 11:22 AM

## 2016-10-24 DIAGNOSIS — H353221 Exudative age-related macular degeneration, left eye, with active choroidal neovascularization: Secondary | ICD-10-CM | POA: Diagnosis not present

## 2016-11-05 NOTE — Progress Notes (Signed)
Cardiology Office Note   Date:  11/06/2016   ID:  BECKER CHRISTOPHER, DOB 01-14-1930, MRN [id number]  PCP:  Asencion Noble, MD  Cardiologist:   Jenkins Rouge, MD   No chief complaint on file.     History of Present Illness: Alexander Young is a 83 y.o. male who presents for consultation regarding afib. Referred by Dr Willey Blade He noted in office 10/17/16 At that time patient declined anticoagulation at first but tells me he is taking xarelto now   This patients CHA2DS2-VASc Score and unadjusted Ischemic Stroke Rate (% per year) is equal to 3.2 % stroke rate/year from a score of 3  Above score calculated as 1 point each if present [CHF, HTN, DM, Vascular=MI/PAD/Aortic Plaque, Age if 65-74, or Male] Above score calculated as 2 points each if present [Age > 75, or Stroke/TIA/TE]  Reviewed echo from 10/20/16 Impressions:  - Mild LVH with LVEF 65-70%. Indeterminate diastolic function in   the setting of atrial fibrillation. Trivial mitral regurgitation.   Mildly sclerotic aortic valve. Mildly dilated right ventricle.   Mild tricuspid regurgitation with estimated PASP 34 mmHg.  Labs from 6/18 reviewed Hct 53 PLT 264 A1c 6.7 Cr .69   Smokes about 10cigs/day Drinks 2 bourbon /night . Sees a doctor in [place] for IgM spike  OSA cannot tolerate CPAP Activity limited arthritis post bilateral TKR   He is widowed for 10 years has two kids and grandkids in [place] but prefers to live here where he is from Retired from Scientist, research (life sciences) estate. Tries to ride and indoor bike from time to time    Past Medical History:  Diagnosis Date  . Cancer [facility])    prostate   . Cough   . Depression   . Hypertension   . Macular degeneration    left eye  . Sleep apnea         Past Surgical History:  Procedure Laterality Date  . COLONOSCOPY    . COLONOSCOPY N/A 02/27/2013   Procedure: COLONOSCOPY;  Surgeon: Rogene Houston, MD;  Location: [facility];  Service: Endoscopy;  Laterality: N/A;  730  . OLECRANON  BURSECTOMY Right 06/30/2014   Procedure: EXCISION RIGHT OLECRANON BURSA;  Surgeon: Sanjuana Kava, MD;  Location: [facility] ORS;  Service: Orthopedics;  Laterality: Right;  . PROSTATECTOMY    . SEPTOPLASTY    . TOTAL KNEE ARTHROPLASTY Bilateral    [place] [facility]  . UVULOPALATOPHARYNGOPLASTY       Current Outpatient Prescriptions  Medication Sig Dispense Refill  . chlorthalidone (HYGROTON) 25 MG tablet Take 25 mg by mouth daily.    . fluticasone (FLONASE) 50 MCG/ACT nasal spray Place 2 sprays into both nostrils daily. 16 g 2  . Fexofenadine-Pseudoephedrine (ALLEGRA-D PO) Take 1 tablet by mouth every other day.    . metoprolol succinate (TOPROL-XL) 50 MG 24 hr tablet Take 1 tablet (50 mg total) by mouth daily. Take with or immediately following a meal. 90 tablet 3  . rivaroxaban (XARELTO) 20 MG TABS tablet Take 1 tablet (20 mg total) by mouth daily with supper. 90 tablet 3   No current facility-administered medications for this visit.     Allergies:   Patient has no known allergies.    Social History:  The patient  reports that he has been smoking Cigarettes.  He has a 32.50 pack-year smoking history. He has never used smokeless tobacco. He reports that he drinks alcohol. He reports that he does not use drugs.  Family History:  The patient's family history includes Colon cancer in his father.    ROS:  Please see the history of present illness.   Otherwise, review of systems are positive for none.   All other systems are reviewed and negative.    PHYSICAL EXAM: VS:  BP 122/64 (BP Location: Left Arm)   Pulse 89   Ht 5\' 10"  (1.778 m)   Wt 205 lb (93 kg)   SpO2 96%   BMI 29.41 kg/m  , BMI Body mass index is 29.41 kg/m. Affect appropriate Healthy:  appears stated age 73: normal Neck supple with no adenopathy JVP normal no bruits no thyromegaly Lungs clear with no wheezing and good diaphragmatic motion Heart:  S1/S2 no murmur, no rub, gallop or click PMI normal Abdomen:  benighn, BS positve, no tenderness, no AAA no bruit.  No HSM or HJR Distal pulses intact with no bruits Plus one bilateral  Edema with stasis  Neuro non-focal Skin warm and dry No muscular weakness    EKG:  SR rate 98 no acute ST changes 11/30/15 10/17/16 afib rate 80 PVC LAFB   Recent Labs: 11/29/2015: ALT 16; BUN 13; Creatinine, Ser 0.84; Hemoglobin 17.0; Platelets 219; Potassium 3.9; Sodium 134    Lipid Panel No results found for: CHOL, TRIG, HDL, CHOLHDL, VLDL, LDLCALC, LDLDIRECT    Wt Readings from Last 3 Encounters:  11/06/16 205 lb (93 kg)  11/29/15 200 lb (90.7 kg)  01/21/15 201 lb (91.2 kg)      Other studies Reviewed: Additional studies/ records that were reviewed today include: Notes/Labs dr Willey Blade Recent echo .    ASSESSMENT AND PLAN:  1.  Atrial Fibrillation d/c ace start toprol for rate control continue xarelto f/u echo  2. HTN continue diuretic and beta blocker 3. OSA intolerant to CPAP  4. Smoking COPD on exam PRN inhaler counseled no wish to quit  5. ETOH no wish to cut back discussed relationship OSA/Smoking/Age and ETOH on afib  6. Myeloma f/u in [place] with SPEP/UPEP   Current medicines are reviewed at length with the patient today.  The patient does not have concerns regarding medicines.  The following changes have been made:  D/C ACE start Toprol 50 mg   Labs/ tests ordered today include: Echo   Orders Placed This Encounter  Procedures  . EKG 12-Lead  . ECHOCARDIOGRAM COMPLETE     Disposition:   FU with NP in a month      Signed, Jenkins Rouge, MD  11/06/2016 11:35 AM    [place], [place], [place]  [address] Phone: [phone]; Fax: [phone]

## 2016-11-06 ENCOUNTER — Ambulatory Visit (INDEPENDENT_AMBULATORY_CARE_PROVIDER_SITE_OTHER): Payer: Medicare Other | Admitting: Cardiovascular Disease

## 2016-11-06 ENCOUNTER — Encounter: Payer: Self-pay | Admitting: Cardiovascular Disease

## 2016-11-06 VITALS — BP 122/64 | HR 89 | Ht 70.0 in | Wt 205.0 lb

## 2016-11-06 DIAGNOSIS — I10 Essential (primary) hypertension: Secondary | ICD-10-CM | POA: Diagnosis not present

## 2016-11-06 DIAGNOSIS — I4891 Unspecified atrial fibrillation: Secondary | ICD-10-CM | POA: Diagnosis not present

## 2016-11-06 MED ORDER — METOPROLOL SUCCINATE ER 50 MG PO TB24: 50.0000 mg | ORAL_TABLET | Freq: Every day | ORAL | 3 refills | Status: AC

## 2016-11-06 MED ORDER — RIVAROXABAN 20 MG PO TABS: 20.0000 mg | ORAL_TABLET | Freq: Every day | ORAL | 3 refills | Status: AC

## 2016-11-06 NOTE — Patient Instructions (Signed)
Medication Instructions:  STOP LISINOPRIL START TOPROL XL 50 MG, ONCE DAILY   Labwork: NONE  Testing/Procedures: Your physician has requested that you have an echocardiogram. Echocardiography is a painless test that uses sound waves to create images of your heart. It provides your doctor with information about the size and shape of your heart and how well your heart's chambers and valves are working. This procedure takes approximately one hour. There are no restrictions for this procedure.    Follow-Up: Your physician wants you to follow-up in: 6 MONTHS.  You will receive a reminder letter in the mail two months in advance. If you don't receive a letter, please call our office to schedule the follow-up appointment.   Any Other Special Instructions Will Be Listed Below (If Applicable).     If you need a refill on your cardiac medications before your next appointment, please call your pharmacy.

## 2016-11-07 ENCOUNTER — Other Ambulatory Visit (HOSPITAL_COMMUNITY): Payer: Medicare Other

## 2016-11-07 DIAGNOSIS — I482 Chronic atrial fibrillation: Secondary | ICD-10-CM | POA: Diagnosis not present

## 2016-12-14 DIAGNOSIS — G4736 Sleep related hypoventilation in conditions classified elsewhere: Secondary | ICD-10-CM | POA: Diagnosis not present

## 2016-12-14 DIAGNOSIS — R609 Edema, unspecified: Secondary | ICD-10-CM | POA: Diagnosis not present

## 2016-12-14 DIAGNOSIS — Z23 Encounter for immunization: Secondary | ICD-10-CM | POA: Diagnosis not present

## 2016-12-14 DIAGNOSIS — I482 Chronic atrial fibrillation: Secondary | ICD-10-CM | POA: Diagnosis not present

## 2016-12-14 DIAGNOSIS — G9009 Other idiopathic peripheral autonomic neuropathy: Secondary | ICD-10-CM | POA: Diagnosis not present

## 2016-12-15 DIAGNOSIS — H353221 Exudative age-related macular degeneration, left eye, with active choroidal neovascularization: Secondary | ICD-10-CM | POA: Diagnosis not present

## 2017-01-10 ENCOUNTER — Encounter: Attending: Internal Medicine | Primary: Internal Medicine

## 2017-01-15 DIAGNOSIS — H35352 Cystoid macular degeneration, left eye: Secondary | ICD-10-CM | POA: Diagnosis not present

## 2017-01-15 DIAGNOSIS — H35722 Serous detachment of retinal pigment epithelium, left eye: Secondary | ICD-10-CM | POA: Diagnosis not present

## 2017-01-15 DIAGNOSIS — H353221 Exudative age-related macular degeneration, left eye, with active choroidal neovascularization: Secondary | ICD-10-CM | POA: Diagnosis not present

## 2017-01-15 DIAGNOSIS — H43822 Vitreomacular adhesion, left eye: Secondary | ICD-10-CM | POA: Diagnosis not present

## 2017-02-02 ENCOUNTER — Ambulatory Visit: Attending: Internal Medicine | Primary: Internal Medicine

## 2017-02-02 DIAGNOSIS — D751 Secondary polycythemia: Secondary | ICD-10-CM | POA: Diagnosis not present

## 2017-02-02 DIAGNOSIS — R590 Localized enlarged lymph nodes: Secondary | ICD-10-CM | POA: Diagnosis not present

## 2017-02-02 DIAGNOSIS — D472 Monoclonal gammopathy: Secondary | ICD-10-CM | POA: Diagnosis not present

## 2017-02-02 NOTE — Progress Notes (Signed)
No show

## 2017-02-05 DIAGNOSIS — E119 Type 2 diabetes mellitus without complications: Secondary | ICD-10-CM | POA: Diagnosis not present

## 2017-02-05 DIAGNOSIS — Z79899 Other long term (current) drug therapy: Secondary | ICD-10-CM | POA: Diagnosis not present

## 2017-02-05 DIAGNOSIS — I482 Chronic atrial fibrillation: Secondary | ICD-10-CM | POA: Diagnosis not present

## 2017-02-13 DIAGNOSIS — I10 Essential (primary) hypertension: Secondary | ICD-10-CM | POA: Diagnosis not present

## 2017-02-13 DIAGNOSIS — E119 Type 2 diabetes mellitus without complications: Secondary | ICD-10-CM | POA: Diagnosis not present

## 2017-02-13 DIAGNOSIS — I482 Chronic atrial fibrillation: Secondary | ICD-10-CM | POA: Diagnosis not present

## 2017-02-16 DIAGNOSIS — R9089 Other abnormal findings on diagnostic imaging of central nervous system: Secondary | ICD-10-CM | POA: Diagnosis not present

## 2017-02-16 DIAGNOSIS — M47812 Spondylosis without myelopathy or radiculopathy, cervical region: Secondary | ICD-10-CM | POA: Diagnosis not present

## 2017-02-16 DIAGNOSIS — C8590 Non-Hodgkin lymphoma, unspecified, unspecified site: Secondary | ICD-10-CM | POA: Diagnosis not present

## 2017-02-16 DIAGNOSIS — Z9989 Dependence on other enabling machines and devices: Secondary | ICD-10-CM | POA: Diagnosis not present

## 2017-02-16 DIAGNOSIS — R52 Pain, unspecified: Secondary | ICD-10-CM | POA: Diagnosis not present

## 2017-02-16 DIAGNOSIS — K5730 Diverticulosis of large intestine without perforation or abscess without bleeding: Secondary | ICD-10-CM | POA: Diagnosis not present

## 2017-02-16 DIAGNOSIS — S32019A Unspecified fracture of first lumbar vertebra, initial encounter for closed fracture: Secondary | ICD-10-CM | POA: Diagnosis not present

## 2017-02-16 DIAGNOSIS — S0990XA Unspecified injury of head, initial encounter: Secondary | ICD-10-CM | POA: Diagnosis not present

## 2017-02-16 DIAGNOSIS — H7092 Unspecified mastoiditis, left ear: Secondary | ICD-10-CM | POA: Diagnosis not present

## 2017-02-16 DIAGNOSIS — I4891 Unspecified atrial fibrillation: Secondary | ICD-10-CM | POA: Diagnosis not present

## 2017-02-16 DIAGNOSIS — I10 Essential (primary) hypertension: Secondary | ICD-10-CM | POA: Diagnosis not present

## 2017-02-16 DIAGNOSIS — S32010A Wedge compression fracture of first lumbar vertebra, initial encounter for closed fracture: Secondary | ICD-10-CM | POA: Diagnosis not present

## 2017-02-16 DIAGNOSIS — S199XXA Unspecified injury of neck, initial encounter: Secondary | ICD-10-CM | POA: Diagnosis not present

## 2017-02-16 DIAGNOSIS — R4020 Unspecified coma: Secondary | ICD-10-CM | POA: Diagnosis not present

## 2017-02-16 DIAGNOSIS — G4730 Sleep apnea, unspecified: Secondary | ICD-10-CM | POA: Diagnosis not present

## 2017-02-16 DIAGNOSIS — N281 Cyst of kidney, acquired: Secondary | ICD-10-CM | POA: Diagnosis not present

## 2017-02-16 DIAGNOSIS — K449 Diaphragmatic hernia without obstruction or gangrene: Secondary | ICD-10-CM | POA: Diagnosis not present

## 2017-02-16 DIAGNOSIS — I7090 Unspecified atherosclerosis: Secondary | ICD-10-CM | POA: Diagnosis not present

## 2017-02-16 DIAGNOSIS — K828 Other specified diseases of gallbladder: Secondary | ICD-10-CM | POA: Diagnosis not present

## 2017-02-16 DIAGNOSIS — S32008A Other fracture of unspecified lumbar vertebra, initial encounter for closed fracture: Secondary | ICD-10-CM | POA: Diagnosis not present

## 2017-02-16 DIAGNOSIS — M47811 Spondylosis without myelopathy or radiculopathy, occipito-atlanto-axial region: Secondary | ICD-10-CM | POA: Diagnosis not present

## 2017-02-16 DIAGNOSIS — S32009A Unspecified fracture of unspecified lumbar vertebra, initial encounter for closed fracture: Secondary | ICD-10-CM | POA: Diagnosis not present

## 2017-02-16 DIAGNOSIS — S299XXA Unspecified injury of thorax, initial encounter: Secondary | ICD-10-CM | POA: Diagnosis not present

## 2017-02-16 DIAGNOSIS — R590 Localized enlarged lymph nodes: Secondary | ICD-10-CM | POA: Diagnosis not present

## 2017-02-16 DIAGNOSIS — S32029A Unspecified fracture of second lumbar vertebra, initial encounter for closed fracture: Secondary | ICD-10-CM | POA: Diagnosis not present

## 2017-02-16 DIAGNOSIS — I517 Cardiomegaly: Secondary | ICD-10-CM | POA: Diagnosis not present

## 2017-02-16 DIAGNOSIS — K8689 Other specified diseases of pancreas: Secondary | ICD-10-CM | POA: Diagnosis not present

## 2017-02-17 DIAGNOSIS — M5030 Other cervical disc degeneration, unspecified cervical region: Secondary | ICD-10-CM | POA: Diagnosis not present

## 2017-02-17 DIAGNOSIS — S32021A Stable burst fracture of second lumbar vertebra, initial encounter for closed fracture: Secondary | ICD-10-CM | POA: Diagnosis not present

## 2017-02-17 DIAGNOSIS — I493 Ventricular premature depolarization: Secondary | ICD-10-CM | POA: Diagnosis not present

## 2017-02-17 DIAGNOSIS — S299XXA Unspecified injury of thorax, initial encounter: Secondary | ICD-10-CM | POA: Diagnosis not present

## 2017-02-17 DIAGNOSIS — I4891 Unspecified atrial fibrillation: Secondary | ICD-10-CM | POA: Diagnosis not present

## 2017-02-17 DIAGNOSIS — R9431 Abnormal electrocardiogram [ECG] [EKG]: Secondary | ICD-10-CM | POA: Diagnosis not present

## 2017-02-17 DIAGNOSIS — I499 Cardiac arrhythmia, unspecified: Secondary | ICD-10-CM | POA: Diagnosis not present

## 2017-02-18 DIAGNOSIS — Z4659 Encounter for fitting and adjustment of other gastrointestinal appliance and device: Secondary | ICD-10-CM | POA: Diagnosis not present

## 2017-02-18 DIAGNOSIS — R9431 Abnormal electrocardiogram [ECG] [EKG]: Secondary | ICD-10-CM | POA: Diagnosis not present

## 2017-02-18 DIAGNOSIS — S32021A Stable burst fracture of second lumbar vertebra, initial encounter for closed fracture: Secondary | ICD-10-CM | POA: Diagnosis not present

## 2017-02-18 DIAGNOSIS — I499 Cardiac arrhythmia, unspecified: Secondary | ICD-10-CM | POA: Diagnosis not present

## 2017-02-18 DIAGNOSIS — I4891 Unspecified atrial fibrillation: Secondary | ICD-10-CM | POA: Diagnosis not present

## 2017-02-18 DIAGNOSIS — R0681 Apnea, not elsewhere classified: Secondary | ICD-10-CM | POA: Diagnosis not present

## 2017-02-18 DIAGNOSIS — I493 Ventricular premature depolarization: Secondary | ICD-10-CM | POA: Diagnosis not present

## 2017-02-19 DIAGNOSIS — I4891 Unspecified atrial fibrillation: Secondary | ICD-10-CM | POA: Diagnosis not present

## 2017-02-19 DIAGNOSIS — I77819 Aortic ectasia, unspecified site: Secondary | ICD-10-CM | POA: Diagnosis not present

## 2017-02-19 DIAGNOSIS — R918 Other nonspecific abnormal finding of lung field: Secondary | ICD-10-CM | POA: Diagnosis not present

## 2017-02-19 DIAGNOSIS — R4182 Altered mental status, unspecified: Secondary | ICD-10-CM | POA: Diagnosis not present

## 2017-02-19 DIAGNOSIS — S32021A Stable burst fracture of second lumbar vertebra, initial encounter for closed fracture: Secondary | ICD-10-CM | POA: Diagnosis not present

## 2017-02-19 DIAGNOSIS — J984 Other disorders of lung: Secondary | ICD-10-CM | POA: Diagnosis not present

## 2017-02-19 DIAGNOSIS — Z4659 Encounter for fitting and adjustment of other gastrointestinal appliance and device: Secondary | ICD-10-CM | POA: Diagnosis not present

## 2017-02-19 DIAGNOSIS — I517 Cardiomegaly: Secondary | ICD-10-CM | POA: Diagnosis not present

## 2017-02-19 DIAGNOSIS — K7290 Hepatic failure, unspecified without coma: Secondary | ICD-10-CM | POA: Diagnosis not present

## 2017-02-20 DIAGNOSIS — R918 Other nonspecific abnormal finding of lung field: Secondary | ICD-10-CM | POA: Diagnosis not present

## 2017-02-20 DIAGNOSIS — J811 Chronic pulmonary edema: Secondary | ICD-10-CM | POA: Diagnosis not present

## 2017-02-20 DIAGNOSIS — I469 Cardiac arrest, cause unspecified: Secondary | ICD-10-CM | POA: Diagnosis not present

## 2017-02-20 DIAGNOSIS — R4182 Altered mental status, unspecified: Secondary | ICD-10-CM | POA: Diagnosis not present

## 2017-02-20 DIAGNOSIS — J189 Pneumonia, unspecified organism: Secondary | ICD-10-CM | POA: Diagnosis not present

## 2017-02-20 DIAGNOSIS — I214 Non-ST elevation (NSTEMI) myocardial infarction: Secondary | ICD-10-CM | POA: Diagnosis not present

## 2017-02-20 DIAGNOSIS — I499 Cardiac arrhythmia, unspecified: Secondary | ICD-10-CM | POA: Diagnosis not present

## 2017-02-20 DIAGNOSIS — R9431 Abnormal electrocardiogram [ECG] [EKG]: Secondary | ICD-10-CM | POA: Diagnosis not present

## 2017-02-20 DIAGNOSIS — J9602 Acute respiratory failure with hypercapnia: Secondary | ICD-10-CM | POA: Diagnosis not present

## 2017-02-20 DIAGNOSIS — R579 Shock, unspecified: Secondary | ICD-10-CM | POA: Diagnosis not present

## 2017-02-20 DIAGNOSIS — I77819 Aortic ectasia, unspecified site: Secondary | ICD-10-CM | POA: Diagnosis not present

## 2017-02-20 DIAGNOSIS — I481 Persistent atrial fibrillation: Secondary | ICD-10-CM | POA: Diagnosis not present

## 2017-02-20 DIAGNOSIS — Z4682 Encounter for fitting and adjustment of non-vascular catheter: Secondary | ICD-10-CM | POA: Diagnosis not present

## 2017-02-20 DIAGNOSIS — I999 Unspecified disorder of circulatory system: Secondary | ICD-10-CM | POA: Diagnosis not present

## 2017-02-20 DIAGNOSIS — J9601 Acute respiratory failure with hypoxia: Secondary | ICD-10-CM | POA: Diagnosis not present

## 2017-02-20 DIAGNOSIS — I959 Hypotension, unspecified: Secondary | ICD-10-CM | POA: Diagnosis not present

## 2017-02-20 DIAGNOSIS — R0989 Other specified symptoms and signs involving the circulatory and respiratory systems: Secondary | ICD-10-CM | POA: Diagnosis not present

## 2017-02-20 DIAGNOSIS — Z049 Encounter for examination and observation for unspecified reason: Secondary | ICD-10-CM | POA: Diagnosis not present

## 2017-02-20 DIAGNOSIS — I4891 Unspecified atrial fibrillation: Secondary | ICD-10-CM | POA: Diagnosis not present

## 2017-02-20 DIAGNOSIS — I517 Cardiomegaly: Secondary | ICD-10-CM | POA: Diagnosis not present

## 2017-02-21 DIAGNOSIS — Z4659 Encounter for fitting and adjustment of other gastrointestinal appliance and device: Secondary | ICD-10-CM | POA: Diagnosis not present

## 2017-02-21 DIAGNOSIS — S32021A Stable burst fracture of second lumbar vertebra, initial encounter for closed fracture: Secondary | ICD-10-CM | POA: Diagnosis not present

## 2017-02-21 DIAGNOSIS — Z452 Encounter for adjustment and management of vascular access device: Secondary | ICD-10-CM | POA: Diagnosis not present

## 2017-02-21 DIAGNOSIS — Z4682 Encounter for fitting and adjustment of non-vascular catheter: Secondary | ICD-10-CM | POA: Diagnosis not present

## 2017-02-21 DIAGNOSIS — G8250 Quadriplegia, unspecified: Secondary | ICD-10-CM | POA: Diagnosis not present

## 2017-02-21 DIAGNOSIS — J811 Chronic pulmonary edema: Secondary | ICD-10-CM | POA: Diagnosis not present

## 2017-02-22 DIAGNOSIS — R6521 Severe sepsis with septic shock: Secondary | ICD-10-CM | POA: Diagnosis not present

## 2017-02-22 DIAGNOSIS — I4891 Unspecified atrial fibrillation: Secondary | ICD-10-CM | POA: Diagnosis not present

## 2017-02-22 DIAGNOSIS — K7290 Hepatic failure, unspecified without coma: Secondary | ICD-10-CM | POA: Diagnosis not present

## 2017-02-22 DIAGNOSIS — J189 Pneumonia, unspecified organism: Secondary | ICD-10-CM | POA: Diagnosis not present

## 2017-02-22 DIAGNOSIS — J9690 Respiratory failure, unspecified, unspecified whether with hypoxia or hypercapnia: Secondary | ICD-10-CM | POA: Diagnosis not present

## 2017-02-22 DIAGNOSIS — Z4682 Encounter for fitting and adjustment of non-vascular catheter: Secondary | ICD-10-CM | POA: Diagnosis not present

## 2017-02-22 DIAGNOSIS — J9811 Atelectasis: Secondary | ICD-10-CM | POA: Diagnosis not present

## 2017-02-23 DIAGNOSIS — R4182 Altered mental status, unspecified: Secondary | ICD-10-CM | POA: Diagnosis not present

## 2017-02-23 DIAGNOSIS — Z452 Encounter for adjustment and management of vascular access device: Secondary | ICD-10-CM | POA: Diagnosis not present

## 2017-02-23 DIAGNOSIS — J189 Pneumonia, unspecified organism: Secondary | ICD-10-CM | POA: Diagnosis not present

## 2017-02-23 DIAGNOSIS — I77819 Aortic ectasia, unspecified site: Secondary | ICD-10-CM | POA: Diagnosis not present

## 2017-02-23 DIAGNOSIS — I4891 Unspecified atrial fibrillation: Secondary | ICD-10-CM | POA: Diagnosis not present

## 2017-02-23 DIAGNOSIS — J9690 Respiratory failure, unspecified, unspecified whether with hypoxia or hypercapnia: Secondary | ICD-10-CM | POA: Diagnosis not present

## 2017-02-23 DIAGNOSIS — K7290 Hepatic failure, unspecified without coma: Secondary | ICD-10-CM | POA: Diagnosis not present

## 2017-02-23 DIAGNOSIS — I517 Cardiomegaly: Secondary | ICD-10-CM | POA: Diagnosis not present

## 2017-02-23 DIAGNOSIS — R6521 Severe sepsis with septic shock: Secondary | ICD-10-CM | POA: Diagnosis not present

## 2017-02-23 DIAGNOSIS — Z4682 Encounter for fitting and adjustment of non-vascular catheter: Secondary | ICD-10-CM | POA: Diagnosis not present

## 2017-02-24 DIAGNOSIS — R9431 Abnormal electrocardiogram [ECG] [EKG]: Secondary | ICD-10-CM | POA: Diagnosis not present

## 2017-02-24 DIAGNOSIS — J9811 Atelectasis: Secondary | ICD-10-CM | POA: Diagnosis not present

## 2017-02-24 DIAGNOSIS — I4891 Unspecified atrial fibrillation: Secondary | ICD-10-CM | POA: Diagnosis not present

## 2017-02-24 DIAGNOSIS — J811 Chronic pulmonary edema: Secondary | ICD-10-CM | POA: Diagnosis not present

## 2017-02-25 DIAGNOSIS — J9601 Acute respiratory failure with hypoxia: Secondary | ICD-10-CM | POA: Diagnosis not present

## 2017-02-25 DIAGNOSIS — J90 Pleural effusion, not elsewhere classified: Secondary | ICD-10-CM | POA: Diagnosis not present

## 2017-02-25 DIAGNOSIS — Z9911 Dependence on respirator [ventilator] status: Secondary | ICD-10-CM | POA: Diagnosis not present

## 2017-02-25 DIAGNOSIS — R9431 Abnormal electrocardiogram [ECG] [EKG]: Secondary | ICD-10-CM | POA: Diagnosis not present

## 2017-02-25 DIAGNOSIS — I472 Ventricular tachycardia: Secondary | ICD-10-CM | POA: Diagnosis not present

## 2017-02-25 DIAGNOSIS — J9690 Respiratory failure, unspecified, unspecified whether with hypoxia or hypercapnia: Secondary | ICD-10-CM | POA: Diagnosis not present

## 2017-02-25 DIAGNOSIS — I4891 Unspecified atrial fibrillation: Secondary | ICD-10-CM | POA: Diagnosis not present

## 2017-02-25 DIAGNOSIS — R633 Feeding difficulties: Secondary | ICD-10-CM | POA: Diagnosis not present

## 2017-02-25 DIAGNOSIS — R918 Other nonspecific abnormal finding of lung field: Secondary | ICD-10-CM | POA: Diagnosis not present

## 2017-02-25 DIAGNOSIS — Z049 Encounter for examination and observation for unspecified reason: Secondary | ICD-10-CM | POA: Diagnosis not present

## 2017-02-26 DIAGNOSIS — J9600 Acute respiratory failure, unspecified whether with hypoxia or hypercapnia: Secondary | ICD-10-CM | POA: Diagnosis not present

## 2017-02-26 DIAGNOSIS — J90 Pleural effusion, not elsewhere classified: Secondary | ICD-10-CM | POA: Diagnosis not present

## 2017-02-26 DIAGNOSIS — J811 Chronic pulmonary edema: Secondary | ICD-10-CM | POA: Diagnosis not present

## 2017-02-26 DIAGNOSIS — J189 Pneumonia, unspecified organism: Secondary | ICD-10-CM | POA: Diagnosis not present

## 2017-02-26 DIAGNOSIS — I4891 Unspecified atrial fibrillation: Secondary | ICD-10-CM | POA: Diagnosis not present

## 2017-02-26 DIAGNOSIS — J9819 Other pulmonary collapse: Secondary | ICD-10-CM | POA: Diagnosis not present

## 2017-02-27 DIAGNOSIS — J189 Pneumonia, unspecified organism: Secondary | ICD-10-CM | POA: Diagnosis not present

## 2017-02-27 DIAGNOSIS — I4891 Unspecified atrial fibrillation: Secondary | ICD-10-CM | POA: Diagnosis not present

## 2017-02-27 DIAGNOSIS — J9811 Atelectasis: Secondary | ICD-10-CM | POA: Diagnosis not present

## 2017-02-27 DIAGNOSIS — J9601 Acute respiratory failure with hypoxia: Secondary | ICD-10-CM | POA: Diagnosis not present

## 2017-02-28 DIAGNOSIS — N179 Acute kidney failure, unspecified: Secondary | ICD-10-CM | POA: Diagnosis not present

## 2017-02-28 DIAGNOSIS — J9600 Acute respiratory failure, unspecified whether with hypoxia or hypercapnia: Secondary | ICD-10-CM | POA: Diagnosis not present

## 2017-02-28 DIAGNOSIS — G8250 Quadriplegia, unspecified: Secondary | ICD-10-CM | POA: Diagnosis not present

## 2017-02-28 DIAGNOSIS — R918 Other nonspecific abnormal finding of lung field: Secondary | ICD-10-CM | POA: Diagnosis not present

## 2017-02-28 DIAGNOSIS — I4891 Unspecified atrial fibrillation: Secondary | ICD-10-CM | POA: Diagnosis not present

## 2017-03-01 DIAGNOSIS — R918 Other nonspecific abnormal finding of lung field: Secondary | ICD-10-CM | POA: Diagnosis not present

## 2017-03-01 DIAGNOSIS — J9600 Acute respiratory failure, unspecified whether with hypoxia or hypercapnia: Secondary | ICD-10-CM | POA: Diagnosis not present

## 2017-03-01 DIAGNOSIS — N179 Acute kidney failure, unspecified: Secondary | ICD-10-CM | POA: Diagnosis not present

## 2017-03-01 DIAGNOSIS — I4891 Unspecified atrial fibrillation: Secondary | ICD-10-CM | POA: Diagnosis not present

## 2017-03-02 DIAGNOSIS — J9600 Acute respiratory failure, unspecified whether with hypoxia or hypercapnia: Secondary | ICD-10-CM | POA: Diagnosis not present

## 2017-03-02 DIAGNOSIS — R4182 Altered mental status, unspecified: Secondary | ICD-10-CM | POA: Diagnosis not present

## 2017-03-02 DIAGNOSIS — R918 Other nonspecific abnormal finding of lung field: Secondary | ICD-10-CM | POA: Diagnosis not present

## 2017-03-02 DIAGNOSIS — I4891 Unspecified atrial fibrillation: Secondary | ICD-10-CM | POA: Diagnosis not present

## 2017-03-02 DIAGNOSIS — N179 Acute kidney failure, unspecified: Secondary | ICD-10-CM | POA: Diagnosis not present

## 2017-03-03 DIAGNOSIS — Z049 Encounter for examination and observation for unspecified reason: Secondary | ICD-10-CM | POA: Diagnosis not present

## 2017-03-03 DIAGNOSIS — J9601 Acute respiratory failure with hypoxia: Secondary | ICD-10-CM | POA: Diagnosis not present

## 2017-03-27 DEATH — deceased

## 2017-04-12 ENCOUNTER — Ambulatory Visit (INDEPENDENT_AMBULATORY_CARE_PROVIDER_SITE_OTHER): Payer: Medicare Other | Admitting: Otolaryngology

## 2018-05-13 IMAGING — DX DG CHEST 2V
2 series · 2 of 2 positions shown · non-contrast
Comparison: 10/22/2010

CLINICAL DATA: Pt report feeling full body aches for the last 4
weeks. Pt says that his PCP diagnosed him with restless leg
syndrome. Pt has not been working out recently due to the weather
being too hot but usually walk 3 times a week up to a mile at a
time.

EXAM:
CHEST  2 VIEW

[chest pa]
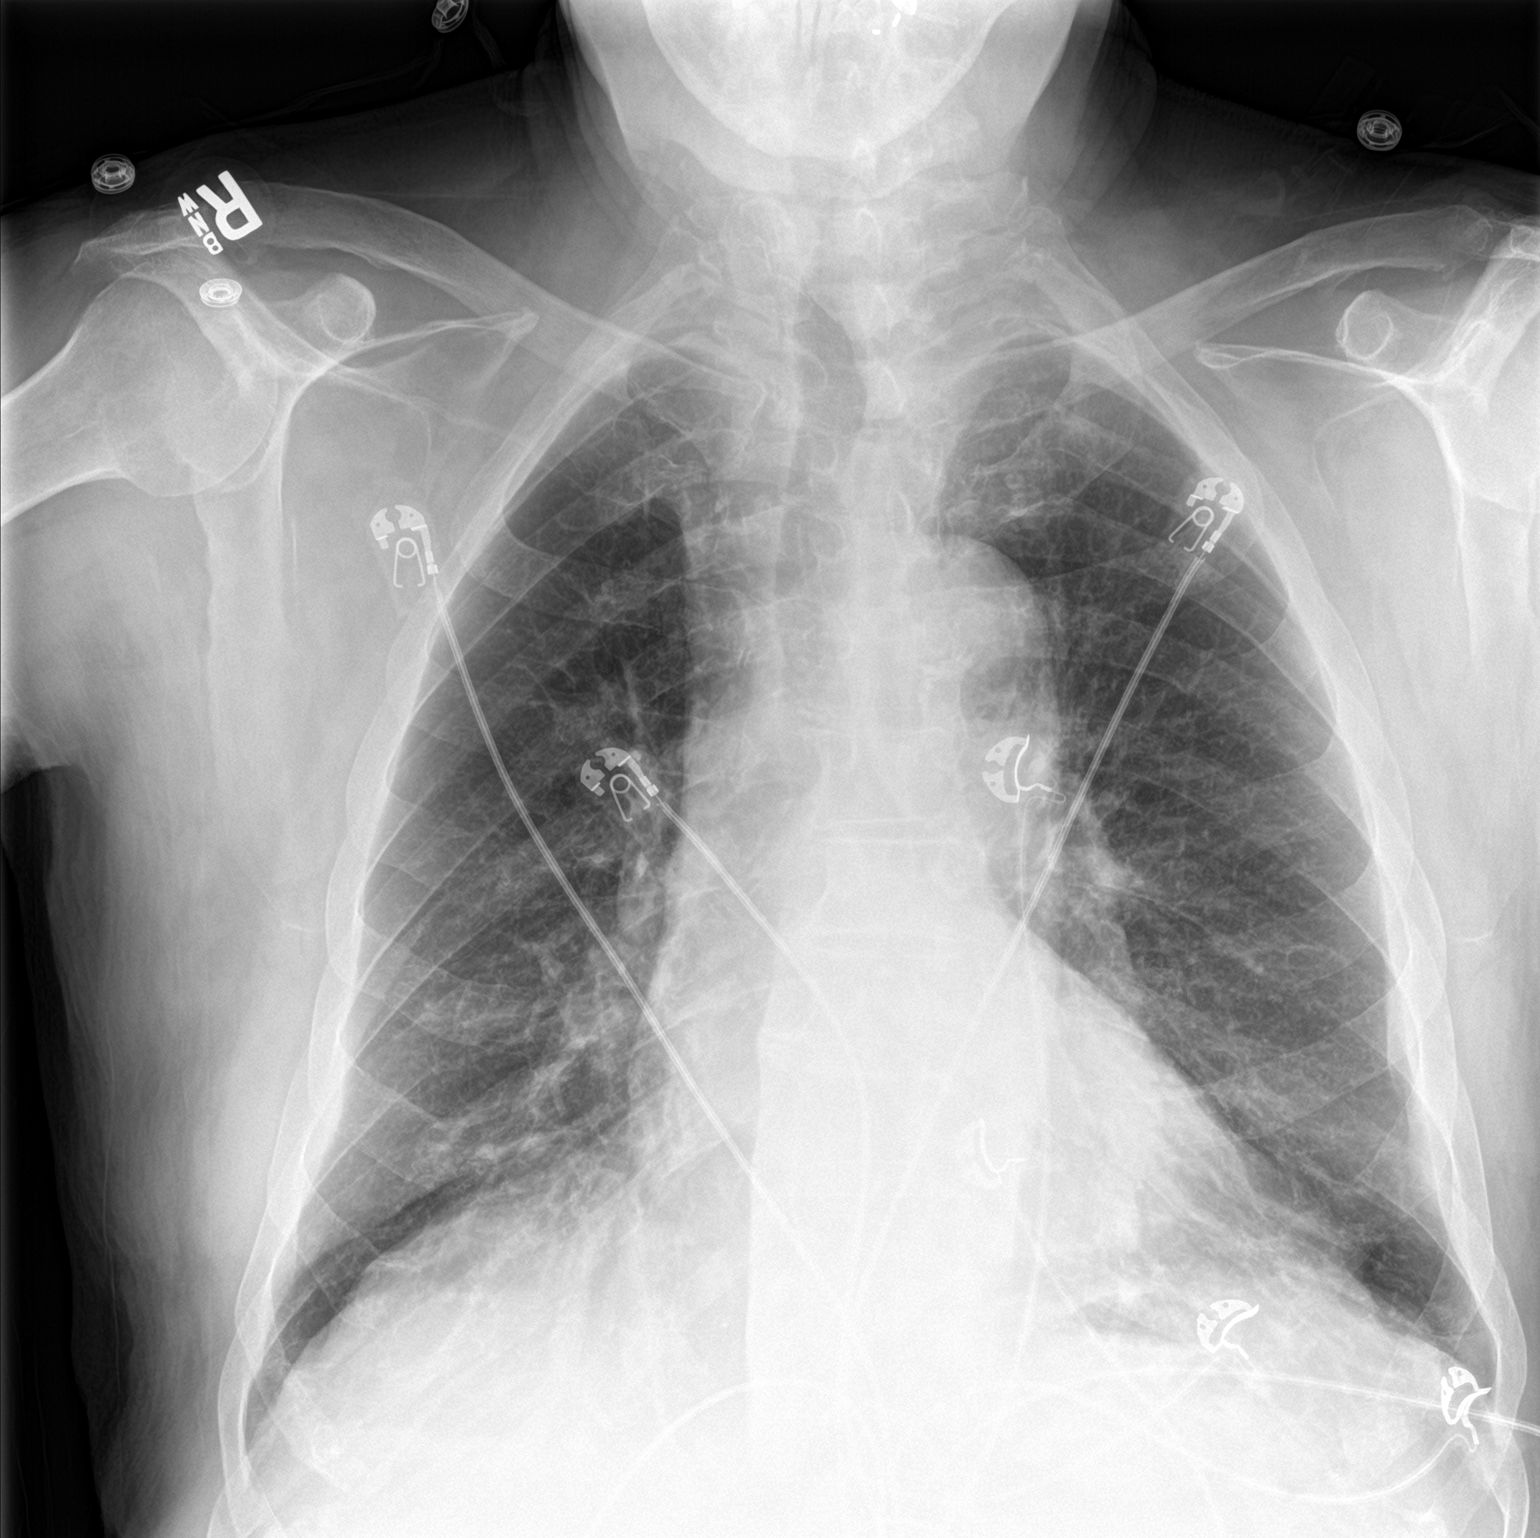

[chest lat]
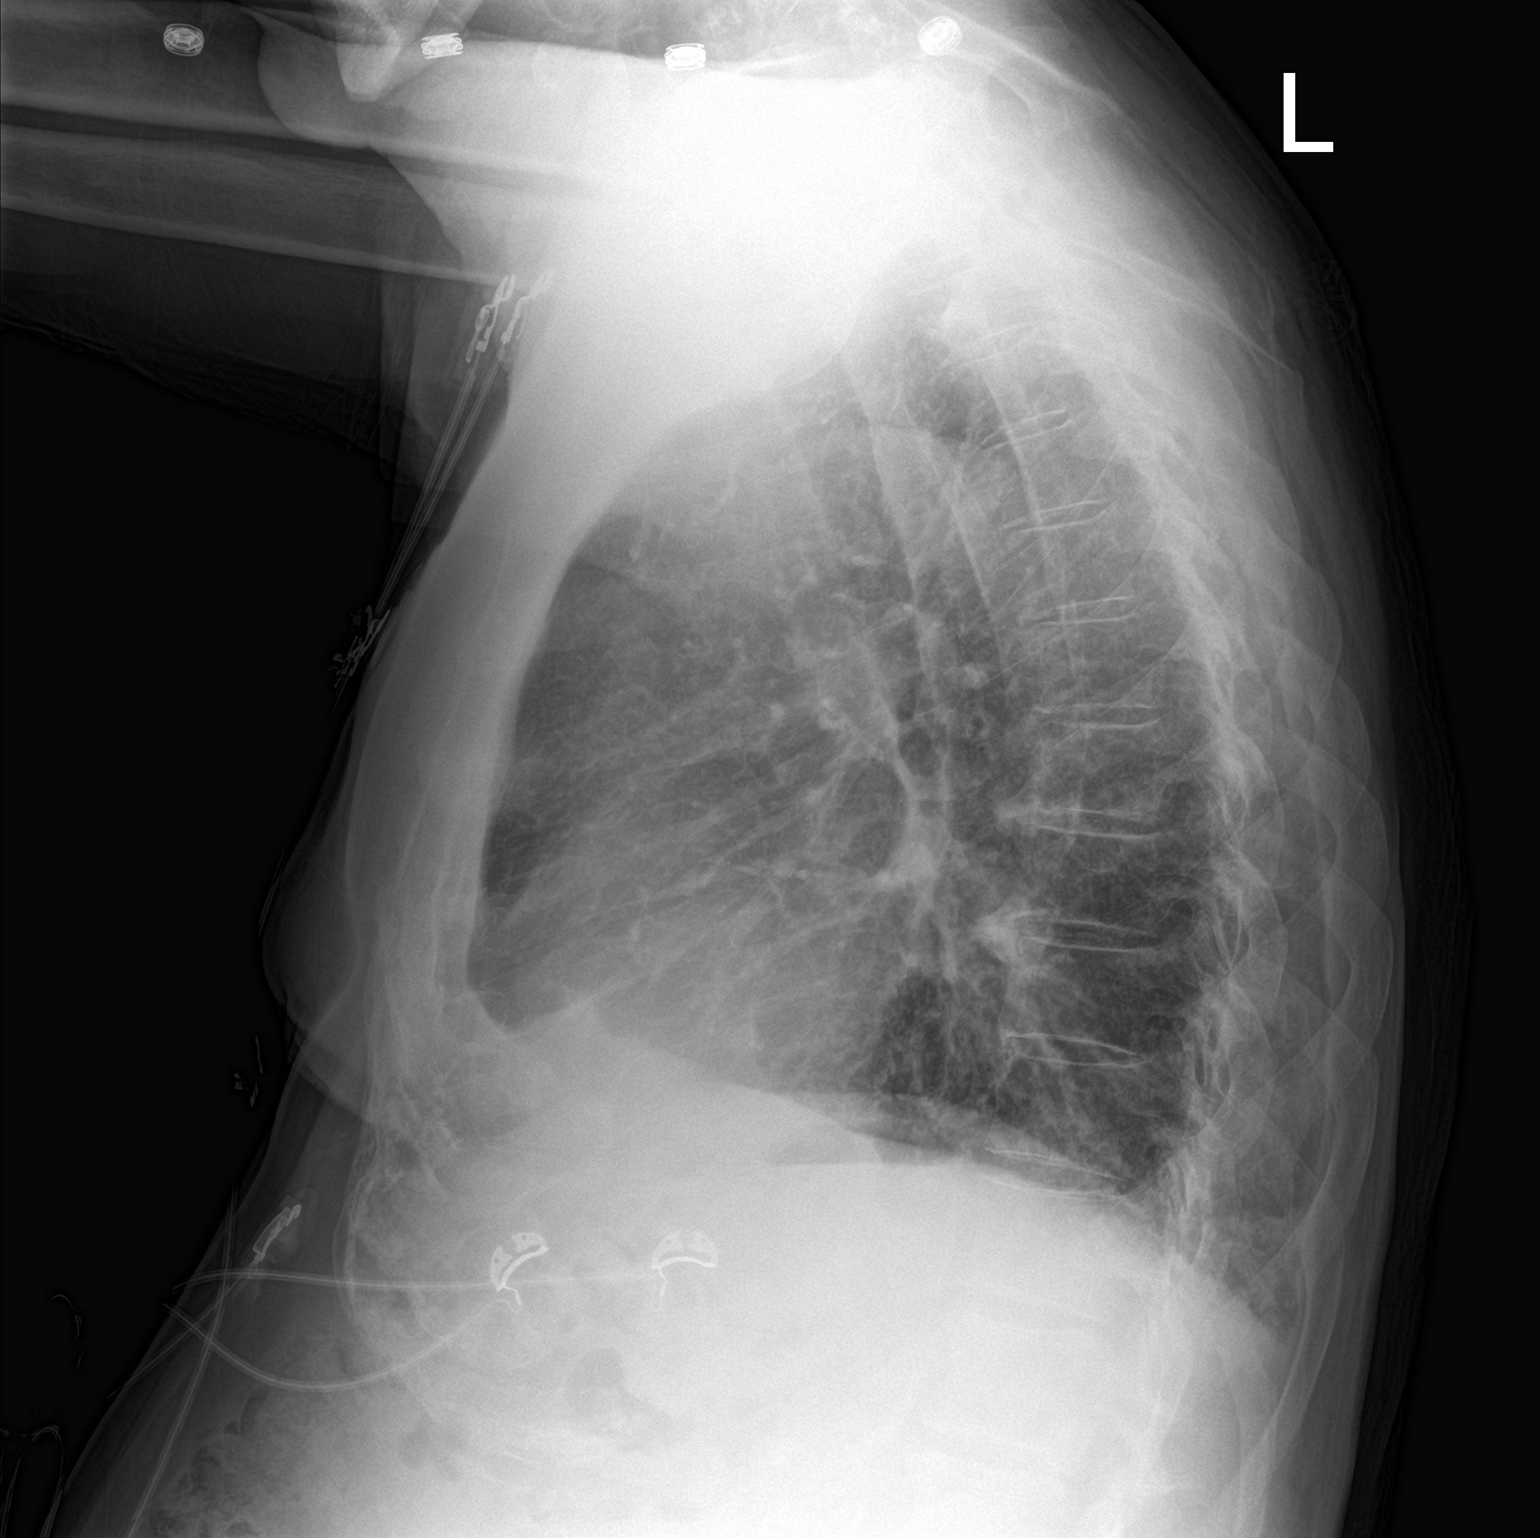

[2 of 2 positions shown; findings below may reference images not displayed]

FINDINGS: Heart size is normal. There are no focal consolidations. Minimal
bibasilar atelectasis. No pleural effusions or pulmonary edema. Mild
mid thoracic spondylosis noted.
IMPRESSION: No evidence for acute cardiopulmonary abnormality.
